# Patient Record
Sex: Female | Born: 1970 | Race: White | Hispanic: No | Marital: Married | Smoking: Former smoker
Health system: Southern US, Community
[De-identification: ages and names within clinical notes are randomized; demographics above are authoritative.]

## PROBLEM LIST (undated history)

## (undated) DIAGNOSIS — I639 Cerebral infarction, unspecified: Secondary | ICD-10-CM

## (undated) DIAGNOSIS — Q211 Atrial septal defect: Secondary | ICD-10-CM

## (undated) DIAGNOSIS — F419 Anxiety disorder, unspecified: Secondary | ICD-10-CM

## (undated) DIAGNOSIS — H539 Unspecified visual disturbance: Secondary | ICD-10-CM

## (undated) HISTORY — DX: Unspecified visual disturbance: H53.9

## (undated) HISTORY — PX: HYDRADENITIS EXCISION: SHX5243

## (undated) HISTORY — DX: Atrial septal defect: Q21.1

---

## 1998-01-02 ENCOUNTER — Other Ambulatory Visit: Admission: RE | Admit: 1998-01-02 | Discharge: 1998-01-02 | Payer: Self-pay | Admitting: Gynecology

## 1998-01-02 ENCOUNTER — Other Ambulatory Visit: Admission: RE | Admit: 1998-01-02 | Discharge: 1998-01-02 | Payer: Self-pay | Admitting: Obstetrics and Gynecology

## 1998-09-03 ENCOUNTER — Inpatient Hospital Stay (HOSPITAL_COMMUNITY): Admission: AD | Admit: 1998-09-03 | Discharge: 1998-09-05 | Payer: Self-pay | Admitting: Obstetrics & Gynecology

## 1999-01-25 ENCOUNTER — Other Ambulatory Visit: Admission: RE | Admit: 1999-01-25 | Discharge: 1999-01-25 | Payer: Self-pay | Admitting: Obstetrics & Gynecology

## 2018-07-04 ENCOUNTER — Emergency Department (HOSPITAL_COMMUNITY): Payer: BC Managed Care – PPO

## 2018-07-04 ENCOUNTER — Inpatient Hospital Stay (HOSPITAL_COMMUNITY)
Admission: EM | Admit: 2018-07-04 | Discharge: 2018-07-06 | DRG: 065 | Disposition: A | Discharge status: Home / Self Care | Payer: BC Managed Care – PPO | Attending: Internal Medicine | Admitting: Internal Medicine

## 2018-07-04 ENCOUNTER — Encounter: Payer: Self-pay | Admitting: Emergency Medicine

## 2018-07-04 DIAGNOSIS — Z880 Allergy status to penicillin: Secondary | ICD-10-CM | POA: Diagnosis not present

## 2018-07-04 DIAGNOSIS — E1165 Type 2 diabetes mellitus with hyperglycemia: Secondary | ICD-10-CM | POA: Diagnosis present

## 2018-07-04 DIAGNOSIS — E785 Hyperlipidemia, unspecified: Secondary | ICD-10-CM | POA: Diagnosis present

## 2018-07-04 DIAGNOSIS — R471 Dysarthria and anarthria: Secondary | ICD-10-CM | POA: Diagnosis present

## 2018-07-04 DIAGNOSIS — Z6826 Body mass index (BMI) 26.0-26.9, adult: Secondary | ICD-10-CM | POA: Diagnosis not present

## 2018-07-04 DIAGNOSIS — Q211 Atrial septal defect: Secondary | ICD-10-CM | POA: Diagnosis not present

## 2018-07-04 DIAGNOSIS — E663 Overweight: Secondary | ICD-10-CM | POA: Diagnosis present

## 2018-07-04 DIAGNOSIS — Z823 Family history of stroke: Secondary | ICD-10-CM | POA: Diagnosis not present

## 2018-07-04 DIAGNOSIS — E781 Pure hyperglyceridemia: Secondary | ICD-10-CM | POA: Diagnosis present

## 2018-07-04 DIAGNOSIS — I639 Cerebral infarction, unspecified: Secondary | ICD-10-CM | POA: Diagnosis present

## 2018-07-04 DIAGNOSIS — I63411 Cerebral infarction due to embolism of right middle cerebral artery: Secondary | ICD-10-CM | POA: Diagnosis present

## 2018-07-04 DIAGNOSIS — R4701 Aphasia: Secondary | ICD-10-CM | POA: Diagnosis not present

## 2018-07-04 DIAGNOSIS — E538 Deficiency of other specified B group vitamins: Secondary | ICD-10-CM | POA: Diagnosis not present

## 2018-07-04 DIAGNOSIS — I63311 Cerebral infarction due to thrombosis of right middle cerebral artery: Secondary | ICD-10-CM

## 2018-07-04 DIAGNOSIS — D519 Vitamin B12 deficiency anemia, unspecified: Secondary | ICD-10-CM | POA: Diagnosis not present

## 2018-07-04 DIAGNOSIS — D649 Anemia, unspecified: Secondary | ICD-10-CM | POA: Diagnosis not present

## 2018-07-04 DIAGNOSIS — I10 Essential (primary) hypertension: Secondary | ICD-10-CM | POA: Diagnosis present

## 2018-07-04 DIAGNOSIS — Z87891 Personal history of nicotine dependence: Secondary | ICD-10-CM

## 2018-07-04 DIAGNOSIS — I6389 Other cerebral infarction: Secondary | ICD-10-CM | POA: Diagnosis not present

## 2018-07-04 DIAGNOSIS — Z882 Allergy status to sulfonamides status: Secondary | ICD-10-CM

## 2018-07-04 DIAGNOSIS — Z881 Allergy status to other antibiotic agents status: Secondary | ICD-10-CM | POA: Diagnosis not present

## 2018-07-04 DIAGNOSIS — F419 Anxiety disorder, unspecified: Secondary | ICD-10-CM | POA: Diagnosis not present

## 2018-07-04 DIAGNOSIS — R29700 NIHSS score 0: Secondary | ICD-10-CM | POA: Diagnosis present

## 2018-07-04 DIAGNOSIS — Z8249 Family history of ischemic heart disease and other diseases of the circulatory system: Secondary | ICD-10-CM | POA: Diagnosis not present

## 2018-07-04 HISTORY — DX: Cerebral infarction, unspecified: I63.9

## 2018-07-04 HISTORY — DX: Anxiety disorder, unspecified: F41.9

## 2018-07-04 LAB — DIFFERENTIAL
Abs Immature Granulocytes: 0.02 10*3/uL (ref 0.00–0.07)
Basophils Absolute: 0.1 10*3/uL (ref 0.0–0.1)
Basophils Relative: 1 %
Eosinophils Absolute: 0.1 10*3/uL (ref 0.0–0.5)
Eosinophils Relative: 1 %
Immature Granulocytes: 0 %
LYMPHS ABS: 1.9 10*3/uL (ref 0.7–4.0)
Lymphocytes Relative: 24 %
Monocytes Absolute: 0.4 10*3/uL (ref 0.1–1.0)
Monocytes Relative: 6 %
Neutro Abs: 5.4 10*3/uL (ref 1.7–7.7)
Neutrophils Relative %: 68 %

## 2018-07-04 LAB — CBC
HCT: 42.1 % (ref 36.0–46.0)
Hemoglobin: 13.1 g/dL (ref 12.0–15.0)
MCH: 29.4 pg (ref 26.0–34.0)
MCHC: 31.1 g/dL (ref 30.0–36.0)
MCV: 94.4 fL (ref 80.0–100.0)
Platelets: 260 10*3/uL (ref 150–400)
RBC: 4.46 MIL/uL (ref 3.87–5.11)
RDW: 11.9 % (ref 11.5–15.5)
WBC: 7.8 10*3/uL (ref 4.0–10.5)
nRBC: 0 % (ref 0.0–0.2)

## 2018-07-04 LAB — COMPREHENSIVE METABOLIC PANEL
ALK PHOS: 66 U/L (ref 38–126)
ALT: 18 U/L (ref 0–44)
AST: 21 U/L (ref 15–41)
Albumin: 4 g/dL (ref 3.5–5.0)
Anion gap: 11 (ref 5–15)
BUN: 6 mg/dL (ref 6–20)
CALCIUM: 9.1 mg/dL (ref 8.9–10.3)
CO2: 21 mmol/L — ABNORMAL LOW (ref 22–32)
Chloride: 108 mmol/L (ref 98–111)
Creatinine, Ser: 0.63 mg/dL (ref 0.44–1.00)
GFR calc Af Amer: >60 mL/min (ref >60)
GFR calc non Af Amer: >60 mL/min (ref >60)
Glucose, Bld: 154 mg/dL — ABNORMAL HIGH (ref 70–99)
Potassium: 3.7 mmol/L (ref 3.5–5.1)
Sodium: 140 mmol/L (ref 135–145)
TOTAL PROTEIN: 6.6 g/dL (ref 6.5–8.1)
Total Bilirubin: 0.9 mg/dL (ref 0.3–1.2)

## 2018-07-04 LAB — I-STAT BETA HCG BLOOD, ED (MC, WL, AP ONLY): I-stat hCG, quantitative: <5 m[IU]/mL (ref <5)

## 2018-07-04 LAB — ETHANOL: Alcohol, Ethyl (B): <10 mg/dL (ref <10)

## 2018-07-04 LAB — APTT: aPTT: 28 seconds (ref 24–36)

## 2018-07-04 LAB — PROTIME-INR
INR: 0.99
Prothrombin Time: 13 seconds (ref 11.4–15.2)

## 2018-07-04 LAB — TSH: TSH: 1.284 u[IU]/mL (ref 0.350–4.500)

## 2018-07-04 LAB — I-STAT TROPONIN, ED: Troponin i, poc: 0.01 ng/mL (ref 0.00–0.08)

## 2018-07-04 MED ORDER — ASPIRIN 300 MG RE SUPP: 300.0000 mg | Freq: Every day | RECTAL | Status: DC

## 2018-07-04 MED ORDER — IOPAMIDOL (ISOVUE-370) INJECTION 76%
75.0000 mL | Freq: Once | INTRAVENOUS | Status: AC | PRN
  Administered 2018-07-04: 75 mL via INTRAVENOUS

## 2018-07-04 MED ORDER — ACETAMINOPHEN 160 MG/5ML PO SOLN: 650.0000 mg | ORAL | Status: DC | PRN

## 2018-07-04 MED ORDER — ASPIRIN 325 MG PO TABS
325.0000 mg | ORAL_TABLET | Freq: Every day | ORAL | Status: DC
  Administered 2018-07-04 – 2018-07-06 (×3): 325 mg via ORAL
  Filled 2018-07-04 (×3): qty 1

## 2018-07-04 MED ORDER — ACETAMINOPHEN 650 MG RE SUPP: 650.0000 mg | RECTAL | Status: DC | PRN

## 2018-07-04 MED ORDER — CLONAZEPAM 0.5 MG PO TABS
0.5000 mg | ORAL_TABLET | Freq: Every day | ORAL | Status: DC
  Administered 2018-07-05: 0.5 mg via ORAL
  Filled 2018-07-04: qty 1

## 2018-07-04 MED ORDER — ACETAMINOPHEN 325 MG PO TABS: 650.0000 mg | ORAL_TABLET | ORAL | Status: DC | PRN

## 2018-07-04 MED ORDER — SENNOSIDES-DOCUSATE SODIUM 8.6-50 MG PO TABS: 1.0000 | ORAL_TABLET | Freq: Every evening | ORAL | Status: DC | PRN

## 2018-07-04 MED ORDER — ENOXAPARIN SODIUM 40 MG/0.4ML ~~LOC~~ SOLN
40.0000 mg | SUBCUTANEOUS | Status: DC
  Administered 2018-07-04 – 2018-07-05 (×2): 40 mg via SUBCUTANEOUS
  Filled 2018-07-04 (×2): qty 0.4

## 2018-07-04 MED ORDER — SODIUM CHLORIDE 0.9 % IV SOLN
INTRAVENOUS | Status: DC
  Administered 2018-07-04 – 2018-07-06 (×3): via INTRAVENOUS

## 2018-07-04 MED ORDER — STROKE: EARLY STAGES OF RECOVERY BOOK
Freq: Once | Status: AC
  Administered 2018-07-04: 20:00:00
  Filled 2018-07-04: qty 1

## 2018-07-04 MED ORDER — SODIUM CHLORIDE 0.9 % IV BOLUS
1000.0000 mL | Freq: Once | INTRAVENOUS | Status: AC
  Administered 2018-07-05: 1000 mL via INTRAVENOUS

## 2018-07-04 MED ORDER — ATORVASTATIN CALCIUM 80 MG PO TABS
80.0000 mg | ORAL_TABLET | Freq: Every day | ORAL | Status: DC
  Administered 2018-07-04: 80 mg via ORAL
  Filled 2018-07-04: qty 1

## 2018-07-04 MED ORDER — LORATADINE 10 MG PO TABS
10.0000 mg | ORAL_TABLET | Freq: Every day | ORAL | Status: DC
  Administered 2018-07-05 – 2018-07-06 (×2): 10 mg via ORAL
  Filled 2018-07-04 (×2): qty 1

## 2018-07-04 NOTE — Progress Notes (Signed)
   07/04/18 1600  Clinical Encounter Type  Visited With Patient and family together;Patient;Family  Visit Type Initial;Follow-up;Social support;Psychological support;ED  Referral From Family;Other (Comment) (pt's relative is [employer] employee)  Spiritual Encounters  Spiritual Needs Emotional  Stress Factors  Patient Stress Factors Loss of control;Health changes;Exhausted  Family Stress Factors Loss of control;Lack of knowledge   Pt is SIL of [place] in [facility] ED, who told me pt was coming in to ED.  Met w/ pt's spouse at pt rm while pt out for scan.  Returned when came across family members who were in ED pod look for room; walked them to pt rm, introduced self to pt.  Myra Gianotti resident, [phone]

## 2018-07-04 NOTE — ED Notes (Signed)
Pt had no difficulties with swallowing.

## 2018-07-04 NOTE — H&P (Signed)
History and Physical    Krystal Oneal NWG:[id number] DOB: 1971/06/05 DOA: 07/04/2018  PCP: Everlean CherryWhyte, Thomas M, MD Consultants:  None Patient coming from:  Home - lives with husband and son; Jackey LogeOK: Husband, [phone]  Chief Complaint: neurologic symptoms  HPI: Krystal Heysabitha Bushart is a 48 y.o. female with medical history significant of anxiety presenting with dizziness and near syncope.  She got dizzy and nauseated with slurred speech and dyscoordination.  She was fine this AM until about 10AM.  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  She was dizzy and unable to talk.  She finally told him to go back to his desk.  She was dropping things on the floor and the teacher came to check on her.  She was talking nonsense.  She did not have difficulty walking.  Her symptoms lasted through arrival in the ER, still having dysarthria.  Symptoms finally improved about 2-3pm.  Now she feels very tired and sleepy.     ED Course:  No risk factors, had a stroke today.  Last known normal was 10AM.  Neurology is following - back to baseline now but she is an IR candidate until 10AM tomorrow so if symptoms recur please reconsult neuro.  Neuro will order an array of labs given lack of RF and young age.  Review of Systems: As per HPI; otherwise review of systems reviewed and negative.   Ambulatory Status:  Ambulates without assistance  Past Medical History:  Diagnosis Date  . Anxiety   . CVA (cerebral vascular accident) (HCC) 07/04/2018    Past Surgical History:  Procedure Laterality Date  . HYDRADENITIS EXCISION      Social History   Socioeconomic History  . Marital status: Married    Spouse name: Not on file  . Number of children: Not on file  . Years of education: Not on file  . Highest education level: Not on file  Occupational History  . Occupation: Geologist, engineeringteacher assistant  Social Needs  . Financial resource strain: Not on file  . Food insecurity:    Worry: Not on  file    Inability: Not on file  . Transportation needs:    Medical: Not on file    Non-medical: Not on file  Tobacco Use  . Smoking status: Former Smoker    Packs/day: 1.00    Years: 30.00    Pack years: 30.00    Types: Cigarettes    Last attempt to quit: 2017    Years since quitting: 3.0  . Smokeless tobacco: Never Used  Substance and Sexual Activity  . Alcohol use: Never    Frequency: Never  . Drug use: Never  . Sexual activity: Not on file  Lifestyle  . Physical activity:    Days per week: Not on file    Minutes per session: Not on file  . Stress: Not on file  Relationships  . Social connections:    Talks on phone: Not on file    Gets together: Not on file    Attends religious service: Not on file    Active member of club or organization: Not on file    Attends meetings of clubs or organizations: Not on file    Relationship status: Not on file  . Intimate partner violence:    Fear of current or ex partner: Not on file    Emotionally abused: Not on file    Physically abused: Not on file    Forced sexual  activity: Not on file  Other Topics Concern  . Not on file  Social History Narrative  . Not on file    Allergies  Allergen Reactions  . Sulfa Antibiotics Itching  . Amoxicillin Hives and Rash  . Penicillins Hives and Rash    Did it involve swelling of the face/tongue/throat, SOB, or low BP? Yes Did it involve sudden or severe rash/hives, skin peeling, or any reaction on the inside of your mouth or nose? Yes Did you need to seek medical attention at a hospital or doctor's office? No When did it last happen?2 years ago If all above answers are "NO", may proceed with cephalosporin use.     Family History  Problem Relation Age of Onset  . Hypertension Mother   . CVA Mother 74  . Hypertension Father     Prior to Admission medications   Not on File    Physical Exam: Vitals:   07/04/18 1147 07/04/18 1148 07/04/18 1200 07/04/18 1330  BP:  117/85  113/62   Pulse:  90 95 76  Resp:  (!) 23 17 18   Temp:  98.5 F (36.9 C)    TempSrc:  Oral    SpO2:  97% 96% 96%  Weight: 65.8 kg     Height: 5\' 2"  (1.575 m)        . General:  Appears calm and comfortable and is NAD . Eyes:  PERRL, EOMI, normal lids, iris . ENT:  grossly normal hearing, lips & tongue, mmm; appropriate dentition . Neck:  no LAD, masses or thyromegaly; no carotid bruits . Cardiovascular:  RRR, no m/r/g. No LE edema.  Marland Kitchen Respiratory:   CTA bilaterally with no wheezes/rales/rhonchi.  Normal respiratory effort. . Abdomen:  soft, NT, ND, NABS . Back:   normal alignment, no CVAT . Skin:  no rash or induration seen on limited exam . Musculoskeletal:  grossly normal tone BUE/BLE, good ROM, no bony abnormality . Psychiatric: grossly normal mood and affect, speech fluent and appropriate, AOx3 . Neurologic:  CN 2-12 grossly intact, moves all extremities in coordinated fashion, sensation intact    Radiological Exams on Admission: Ct Angio Head W Or Wo Contrast  Result Date: 07/04/2018 CLINICAL DATA:  Code stroke.  Aphasia.  Left-sided weakness. EXAM: CT ANGIOGRAPHY HEAD AND NECK TECHNIQUE: Multidetector CT imaging of the head and neck was performed using the standard protocol during bolus administration of intravenous contrast. Multiplanar CT image reconstructions and MIPs were obtained to evaluate the vascular anatomy. Carotid stenosis measurements (when applicable) are obtained utilizing NASCET criteria, using the distal internal carotid diameter as the denominator. CONTRAST:  18mL ISOVUE-370 IOPAMIDOL (ISOVUE-370) INJECTION 76% COMPARISON:  MRI 07/04/2018 FINDINGS: CT HEAD FINDINGS Brain: No evidence of acute infarction, hemorrhage, hydrocephalus, extra-axial collection or mass lesion/mass effect. Vascular: Negative for hyperdense vessel Skull: Negative Sinuses: Negative Orbits: Negative Review of the MIP images confirms the above findings CTA NECK FINDINGS Aortic arch: Normal  aortic arch. Negative for dissection or atherosclerotic disease. Left vertebral artery origin from the arch Right carotid system: Normal right carotid without stenosis or irregularity. Left carotid system: Normal left carotid without stenosis or irregularity Vertebral arteries: Both vertebral arteries widely patent without stenosis or irregularity. Left vertebral artery origin from the aortic arch. Skeleton: Negative Other neck: Negative Upper chest: Negative Review of the MIP images confirms the above findings CTA HEAD FINDINGS Anterior circulation: Cavernous carotid normal bilaterally. Proximal anterior and middle cerebral arteries widely patent bilaterally without stenosis. Occluded branch of distal right MCA  in the right parietal region. This corresponds to right M3/M4 occluded segment and corresponds to the area of restricted diffusion on MRI. This is likely a small embolus. No other branch occlusions. Posterior circulation: Both vertebral arteries widely patent to the basilar. PICA patent bilaterally. Basilar widely patent. AICA, superior cerebellar, and posterior cerebral arteries widely patent Venous sinuses: Patent Anatomic variants: None Delayed phase: Not performed Review of the MIP images confirms the above findings IMPRESSION: 1. Right M3/M4 branch occlusion in the right parietal lobe corresponding to acute infarct on MRI. This is presumably an embolus from the heart. No other significant intracranial or extracranial disease. 2. These results were called by telephone at the time of interpretation on 07/04/2018 at 4:09 pm to Dr. Otelia Limes , who verbally acknowledged these results. Electronically Signed   By: Marlan Palau M.D.   On: 07/04/2018 16:10   Ct Angio Neck W And/or Wo Contrast  Result Date: 07/04/2018 CLINICAL DATA:  Code stroke.  Aphasia.  Left-sided weakness. EXAM: CT ANGIOGRAPHY HEAD AND NECK TECHNIQUE: Multidetector CT imaging of the head and neck was performed using the standard protocol  during bolus administration of intravenous contrast. Multiplanar CT image reconstructions and MIPs were obtained to evaluate the vascular anatomy. Carotid stenosis measurements (when applicable) are obtained utilizing NASCET criteria, using the distal internal carotid diameter as the denominator. CONTRAST:  75mL ISOVUE-370 IOPAMIDOL (ISOVUE-370) INJECTION 76% COMPARISON:  MRI 07/04/2018 FINDINGS: CT HEAD FINDINGS Brain: No evidence of acute infarction, hemorrhage, hydrocephalus, extra-axial collection or mass lesion/mass effect. Vascular: Negative for hyperdense vessel Skull: Negative Sinuses: Negative Orbits: Negative Review of the MIP images confirms the above findings CTA NECK FINDINGS Aortic arch: Normal aortic arch. Negative for dissection or atherosclerotic disease. Left vertebral artery origin from the arch Right carotid system: Normal right carotid without stenosis or irregularity. Left carotid system: Normal left carotid without stenosis or irregularity Vertebral arteries: Both vertebral arteries widely patent without stenosis or irregularity. Left vertebral artery origin from the aortic arch. Skeleton: Negative Other neck: Negative Upper chest: Negative Review of the MIP images confirms the above findings CTA HEAD FINDINGS Anterior circulation: Cavernous carotid normal bilaterally. Proximal anterior and middle cerebral arteries widely patent bilaterally without stenosis. Occluded branch of distal right MCA in the right parietal region. This corresponds to right M3/M4 occluded segment and corresponds to the area of restricted diffusion on MRI. This is likely a small embolus. No other branch occlusions. Posterior circulation: Both vertebral arteries widely patent to the basilar. PICA patent bilaterally. Basilar widely patent. AICA, superior cerebellar, and posterior cerebral arteries widely patent Venous sinuses: Patent Anatomic variants: None Delayed phase: Not performed Review of the MIP images confirms  the above findings IMPRESSION: 1. Right M3/M4 branch occlusion in the right parietal lobe corresponding to acute infarct on MRI. This is presumably an embolus from the heart. No other significant intracranial or extracranial disease. 2. These results were called by telephone at the time of interpretation on 07/04/2018 at 4:09 pm to Dr. Otelia Limes , who verbally acknowledged these results. Electronically Signed   By: Marlan Palau M.D.   On: 07/04/2018 16:10   Mr Brain Wo Contrast  Result Date: 07/04/2018 CLINICAL DATA:  Left arm weakness, aphasia. EXAM: MRI HEAD WITHOUT CONTRAST TECHNIQUE: Multiplanar, multiecho pulse sequences of the brain and surrounding structures were obtained without intravenous contrast. COMPARISON:  MRI head 06/26/2003 FINDINGS: Brain: Restricted diffusion in the right parietal lobe compatible with acute infarct. This involves region of the motor strep as well as the  high medial parietal lobe. Small area of acute infarct extends into the posterior insula on the right. FLAIR imaging negative for edema. Ventricle size normal. No significant chronic ischemia. Negative for mass lesion. Vascular: Small area of susceptibility in the right parietal lobe probable area of right MCA clot. There appears to be increased signal in the right parietal branch in this area suggesting slow flow. Skull and upper cervical spine: Negative Sinuses/Orbits: Negative Other: Mild IMPRESSION: Acute infarct posterior right MCA territory involving the posterior insula and right parietal lobe in the region of the motor cortex. Negative FLAIR imaging. Probable clot in distal right MCA branch versus focal microhemorrhage. Increased flow in this vessel on FLAIR suggests slow flow. These results were called by telephone at the time of interpretation on 07/04/2018 at 3:00 pm to Dr. Particia Nearing, who verbally acknowledged these results. Electronically Signed   By: Marlan Palau M.D.   On: 07/04/2018 15:01    EKG: Independently  reviewed.  NSR with rate 87; low voltage with no evidence of acute ischemia   Labs on Admission: I have personally reviewed the available labs and imaging studies at the time of the admission.  Pertinent labs:   CO2 21 Glucose 154 Otherwise normal CMP Normal CBC INR 0.99 ETOH <10  Assessment/Plan Principal Problem:   CVA (cerebral vascular accident) (HCC) Active Problems:   Anxiety    CVA -Patient without known CVD RF presenting with acute onset of neurologic symptoms this AM concerning for CVA -MRI confirms posterior right MCA CVA -Symptoms have since resolved -Will admit for further CVA evaluation -Telemetry monitoring -Echo with bubble study -Risk stratification with FLP, A1c; will also check TSH and UDS -ASA daily -Neurology consult -PT/OT/ST/Nutrition Consults -No known h/o HTN but will monitor and allow permissive HTN for now; current BPs are low normal -Check FLP; start Lipitor 80 mg daily -No known h/o DM - check A1c and monitor -She will likely need a hypercoagulable work-up, but will defer to neuro at this time -If her symptoms recur, she remains an IR candidate through 10AM tomorrow.  The patient and her family have been informed to monitor carefully for development of symptoms and notify nursing staff immediately if symptoms recur.   Anxiety -This is her only know medical problem and she takes prn Klonopin qhs for this issue; will continue     DVT prophylaxis:  Lovenox  Code Status: Full - confirmed with patient/family Family Communication: Multiple family members present throughout evaluation Disposition Plan:  Home once clinically improved Consults called: Neurology; PT/OT/ST/Nutrition  Admission status: Admit - It is my clinical opinion that admission to INPATIENT is reasonable and necessary because of the expectation that this patient will require hospital care that crosses at least 2 midnights to treat this condition based on the medical complexity of  the problems presented.  Given the aforementioned information, the predictability of an adverse outcome is felt to be significant.   Jonah Blue MD [facility]  If note is complete, please contact covering daytime or nighttime physician. [email or web address]   07/04/2018, 5:21 PM

## 2018-07-04 NOTE — Code Documentation (Signed)
48 yo female coming from work with complaints of sudden onset of trouble speaking and weakness of her left hand. Pt has hx of smoking and menopause placed on estradiol in October. No other medical hx noted by patient. Pt called EMS and when EMS arrived symptoms were resolved. Upon arrival to ED, pt had an NIHSS 0 per medical staff. EDP ordered MRI and MRI showed acute infarct of the right MCA territory. Code Stroke activated and Stroke Team met patient in E33. Initial NIHSS 0. CTA ordered due to presenting symptoms. CTA showed M3/M4 occlusion with corresponding MRI. Patient to be admitted. Handoff given to Ronalee Belts, Therapist, sports.

## 2018-07-04 NOTE — ED Notes (Signed)
Activated code stroke with [facility] @ 15:00 per Dr. Particia Nearing

## 2018-07-04 NOTE — ED Notes (Signed)
Pt transported to MRI 

## 2018-07-04 NOTE — Consult Note (Signed)
Neurology Consultation  Reason for Consult: Code stroke  Referring Physician: Dr. Gilford Raid  CC: Code stroke  History is obtained from: patient  HPI: Krystal Oneal is a left handed 48 y.o. female with a past medical history of anxiety.  She works as a Pharmacist, hospital.  She states that during work today she was talking to 1 of her students when suddenly she could not get her words out.  She also noted persistent weakness in her left arm in which she was dropping things and could not pick them up.  This lasted for about 45 minutes. By the time she got to the hospital all her symptoms had resolved.  MRI did show an acute infarct in the posterior right MCA territory involving the posterior insula and right parietal lobe in the region of the motor cortex.  Code stroke was called.  At time of arrival all symptoms had resolved.  LKW: 10 AM on 07/04/2018 tpa given?: no, symptoms resolved Premorbid modified Rankin scale (mRS): 0 Stroke scale 0  ROS: A 14 point ROS was performed and is negative except as noted in the HPI.  Past Medical History:  Diagnosis Date  . Anxiety      Family History  Problem Relation Age of Onset  . Hypertension Mother   . Hypertension Father    Social History:   reports that she has quit smoking. She has never used smokeless tobacco. She reports previous alcohol use. She reports previous drug use.  Medications No current facility-administered medications for this encounter.  No current outpatient medications on file.  Exam: Current vital signs: BP 113/62   Pulse 76   Temp 98.5 F (36.9 C) (Oral)   Resp 18   Ht _0  (1.575 m)   Wt 65.8 kg   LMP 06/03/2018   SpO2 96%   BMI 26.52 kg/m  Vital signs in last 24 hours: Temp:  [98.5 F (36.9 C)] 98.5 F (36.9 C) (01/15 1148) Pulse Rate:  [76-95] 76 (01/15 1330) Resp:  [17-23] 18 (01/15 1330) BP: (113-117)/(62-85) 113/62 (01/15 1200) SpO2:  [96 %-97 %] 96 % (01/15 1330) Weight:  [65.8 kg] 65.8 kg (01/15  1147)  Physical Exam  Constitutional: Appears well-developed and well-nourished.  Psych: Affect appropriate to situation Eyes: No scleral injection HENT: No OP obstrucion Head: Normocephalic.  Cardiovascular: Normal rate and regular rhythm.  Respiratory: Effort normal, non-labored breathing GI: Soft.  No distension. There is no tenderness.  Skin: WDI  Neurological Evaluation: Mental Status: Patient is awake, alert, oriented to person, place, month, year, and situation. Patient is able to give a clear and coherent history. No signs of aphasia or neglect Cranial Nerves: II: Visual Fields are full.   III,IV, VI: EOMI without ptosis or diplopia.  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  Pupils are equal, round, and reactive to light. V: Facial sensation is symmetric to temperature VII: Facial movement is symmetric.  VIII: hearing is intact to voice X: No hypophonia XI: Shoulder shrug is symmetric. XII: tongue is midline without atrophy or fasciculations.  Motor: Tone is normal. Bulk is normal. 5/5 strength was present in all four extremities.  Sensory: Sensation is symmetric to light touch and temperature in the arms and legs. Deep Tendon Reflexes: 2+ and symmetric in the biceps and patellae.  Plantars: Toes are downgoing bilaterally.  Cerebellar: FNF and HKS are intact bilaterally  NIHSS: 0   Labs I have reviewed labs in epic and the results pertinent to  this consultation are:  CBC    Component Value Date/Time   WBC 7.8 07/04/2018 1216   RBC 4.46 07/04/2018 1216   HGB 13.1 07/04/2018 1216   HCT 42.1 07/04/2018 1216   PLT 260 07/04/2018 1216   MCV 94.4 07/04/2018 1216   MCH 29.4 07/04/2018 1216   MCHC 31.1 07/04/2018 1216   RDW 11.9 07/04/2018 1216   LYMPHSABS 1.9 07/04/2018 1216   MONOABS 0.4 07/04/2018 1216   EOSABS 0.1 07/04/2018 1216   BASOSABS 0.1 07/04/2018 1216    CMP     Component Value Date/Time   NA 140 07/04/2018 1216   K  3.7 07/04/2018 1216   CL 108 07/04/2018 1216   CO2 21 (L) 07/04/2018 1216   GLUCOSE 154 (H) 07/04/2018 1216   BUN 6 07/04/2018 1216   CREATININE 0.63 07/04/2018 1216   CALCIUM 9.1 07/04/2018 1216   PROT 6.6 07/04/2018 1216   ALBUMIN 4.0 07/04/2018 1216   AST 21 07/04/2018 1216   ALT 18 07/04/2018 1216   ALKPHOS 66 07/04/2018 1216   BILITOT 0.9 07/04/2018 1216   GFRNONAA >60 07/04/2018 1216   GFRAA >60 07/04/2018 1216    Lipid Panel  No results found for: CHOL, TRIG, HDL, CHOLHDL, VLDL, LDLCALC, LDLDIRECT   Imaging I have reviewed the images obtained:  MRI examination of the brain:  Acute infarct posterior right MCA territory involving the posterior insula and right parietal and frontal lobe in the region of the motor cortex as seen on DWI. Negative FLAIR imaging correlate to the DWI findings. Probable clot in distal right MCA branch versus focal microhemorrhage. Increased signal in this vessel on FLAIR suggests slow flow.  CTA of head and neck: Right M3/M4 branch occlusion in the right parietal lobe corresponding to acute infarct on MRI. This is presumably an embolus from the heart. No other significant intracranial or extracranial disease.   Etta Quill PA-C [facility] [phone] 07/04/2018, 3:57 PM     Assessment:  48 year old female presenting to the hospital with transient expressive aphasia and left arm weakness.  Symptoms resolved within 45 minutes.   1. MRI reveals acute infarctions in the right MCA territory.  2. Also noted on MRI is a probable clot in the distal right MCA branch versus focal microhemorrhage per Radiology - Neurologist has reviewed the images and focal microhemorrhage is felt to be unlikely. Increased signal in this vessel on FLAIR suggests slow flow. 3. CTA head and neck: Right M3/M4 branch occlusion in the right parietal lobe corresponding to acute infarct on MRI. This is presumably an embolus from the heart. No other significant  intracranial or extracranial disease. 4. The patient is not a tPA candidate due to NIHSS of 0 and being out of the time window 5. The patient is not an endovascular candidate due to distal location of clot in right M3/M4 branch, limiting accessibility. Also not a candidate due to NIHSS of 0  Recommendations: # Transthoracic Echo. If negative, she will most likely need TEE # Start patient on ASA 325 mg daily,   # Start or continue Atorvastatin 80 mg/other high intensity statin # BP goal: permissive HTN upto 220/120 mmHg for 24 hours, then decrease BP by 15% per day to a SBP goal of 120-130 # HBAIC and Lipid profile # Telemetry monitoring # Frequent neuro checks # NPO until passes stroke swallow screen # Hypercoagulable panel # ESR, C-reactive protein and lupus panel # please page stroke NP  Or  PA  Or MD  from 8am -4 pm  as this patient from this time will be  followed by the stroke.   You can look them up on [email or web address]  Password [email or web address]   I have examined the patient. Exam findings documented by Etta Quill, PA. I have formulated the assessment and plan. Electronically signed: Dr. Kerney Elbe

## 2018-07-04 NOTE — ED Provider Notes (Signed)
[facility] EMERGENCY DEPARTMENT Provider Note   CSN: [id number] Arrival date & time: 07/04/18  1138     History   Chief Complaint Chief Complaint  Patient presents with  . Dizziness  . Near Syncope    HPI Ahnya Saturday is a 48 y.o. female.  The history is provided by the patient and medical records. No language interpreter was used.   Anora Castrogiovanni is a 48 y.o. female  who presents to the Emergency Department complaining of left-sided numbness and weakness.  Patient states that she was working at school as a Runner, broadcasting/film/video when she acutely developed numbness to her left side of the face and left arm 10 AM.  She felt as if she knew what to say, but could not get the words out.  She is having a hard time communicating to the other adult in the room who called 911.  She also felt weak in her left arm, persistently dropping things when she tried to pick them up.  She states the symptoms were present for about 45 minutes, but have since resolved.  She has never experienced this in the past.  He reports no past medical history other than anxiety.  Per chart review, it also appears that she has hypertriglyceridemia which has been trying to be treated with diet.  Last appointment with her PCP was in August 2019.  She denies any headache.  No lower extremity weakness or difficulty with ambulation.  No vision changes.  No medications taken prior to arrival for her symptoms.   Past Medical History:  Diagnosis Date  . Anxiety     There are no active problems to display for this patient.   History reviewed. No pertinent surgical history.   OB History   No obstetric history on file.     Home Medications    Prior to Admission medications   Not on File    Family History Family History  Problem Relation Age of Onset  . Hypertension Mother   . Hypertension Father     Social History Social History   Tobacco Use  . Smoking status: Former Games developer  . Smokeless tobacco:  Never Used  Substance Use Topics  . Alcohol use: Not Currently  . Drug use: Not Currently     Allergies   Amoxicillin; Penicillins; and Sulfa antibiotics   Review of Systems Review of Systems  Neurological: Positive for speech difficulty, weakness and numbness. Negative for syncope, light-headedness and headaches.  All other systems reviewed and are negative.    Physical Exam Updated Vital Signs BP 113/62   Pulse 76   Temp 98.5 F (36.9 C) (Oral)   Resp 18   Ht 5\' 2"  (1.575 m)   Wt 65.8 kg   LMP 06/03/2018   SpO2 96%   BMI 26.52 kg/m   Physical Exam Vitals signs and nursing note reviewed.  Constitutional:      General: She is not in acute distress.    Appearance: She is well-developed.  HENT:     Head: Normocephalic and atraumatic.  Cardiovascular:     Rate and Rhythm: Normal rate and regular rhythm.     Heart sounds: Normal heart sounds. No murmur.  Pulmonary:     Effort: Pulmonary effort is normal. No respiratory distress.     Breath sounds: Normal breath sounds.  Abdominal:     General: There is no distension.     Palpations: Abdomen is soft.     Tenderness: There is no  abdominal tenderness.  Skin:    General: Skin is warm and dry.     Comments: Alert, oriented, thought content appropriate, able to give a coherent history. Speech is clear and goal oriented, able to follow commands.  Cranial Nerves:  II:  Peripheral visual fields grossly normal, pupils equal, round, reactive to light III, IV, VI: EOM intact bilaterally, ptosis not present V,VII: smile symmetric, eyes kept closed tightly against resistance, facial light touch sensation equal VIII: hearing grossly normal IX, X: symmetric soft palate movement, uvula elevates symmetrically  XI: bilateral shoulder shrug symmetric and strong XII: midline tongue extension 5/5 muscle strength in upper and lower extremities bilaterally including strong and equal grip strength and dorsiflexion/plantar  flexion Sensory to light touch normal in all four extremities.  Normal finger-to-nose and rapid alternating movements; normal gait and balance. No pronator drift.  Neurological:     Mental Status: She is alert and oriented to person, place, and time.      ED Treatments / Results  Labs (all labs ordered are listed, but only abnormal results are displayed) Labs Reviewed  COMPREHENSIVE METABOLIC PANEL - Abnormal; Notable for the following components:      Result Value   CO2 21 (*)    Glucose, Bld 154 (*)    All other components within normal limits  ETHANOL  PROTIME-INR  APTT  CBC  DIFFERENTIAL  RAPID URINE DRUG SCREEN, HOSP PERFORMED  URINALYSIS, ROUTINE W REFLEX MICROSCOPIC  I-STAT TROPONIN, ED  I-STAT BETA HCG BLOOD, ED ([facility], [facility], [facility] ONLY)    EKG EKG Interpretation  Date/Time:  Wednesday July 04 2018 11:46:38 EST Ventricular Rate:  87 PR Interval:    QRS Duration: 94 QT Interval:  354 QTC Calculation: 426 R Axis:   44 Text Interpretation:  Sinus rhythm Low voltage, precordial leads No old tracing to compare Confirmed by Jacalyn LefevreHaviland, Julie [phone]([id number]) on 07/04/2018 11:56:57 AM Also confirmed by Jacalyn LefevreHaviland, Julie [phone]), editor Barbette Hairassel, Kerry [phone])  on 07/04/2018 3:10:13 PM   Radiology Ct Angio Head W Or Wo Contrast  Result Date: 07/04/2018 CLINICAL DATA:  Code stroke.  Aphasia.  Left-sided weakness. EXAM: CT ANGIOGRAPHY HEAD AND NECK TECHNIQUE: Multidetector CT imaging of the head and neck was performed using the standard protocol during bolus administration of intravenous contrast. Multiplanar CT image reconstructions and MIPs were obtained to evaluate the vascular anatomy. Carotid stenosis measurements (when applicable) are obtained utilizing NASCET criteria, using the distal internal carotid diameter as the denominator. CONTRAST:  75mL ISOVUE-370 IOPAMIDOL (ISOVUE-370) INJECTION 76% COMPARISON:  MRI 07/04/2018 FINDINGS: CT HEAD FINDINGS Brain: No evidence of acute infarction,  hemorrhage, hydrocephalus, extra-axial collection or mass lesion/mass effect. Vascular: Negative for hyperdense vessel Skull: Negative Sinuses: Negative Orbits: Negative Review of the MIP images confirms the above findings CTA NECK FINDINGS Aortic arch: Normal aortic arch. Negative for dissection or atherosclerotic disease. Left vertebral artery origin from the arch Right carotid system: Normal right carotid without stenosis or irregularity. Left carotid system: Normal left carotid without stenosis or irregularity Vertebral arteries: Both vertebral arteries widely patent without stenosis or irregularity. Left vertebral artery origin from the aortic arch. Skeleton: Negative Other neck: Negative Upper chest: Negative Review of the MIP images confirms the above findings CTA HEAD FINDINGS Anterior circulation: Cavernous carotid normal bilaterally. Proximal anterior and middle cerebral arteries widely patent bilaterally without stenosis. Occluded branch of distal right MCA in the right parietal region. This corresponds to right M3/M4 occluded segment and corresponds to the area of restricted diffusion on MRI.  This is likely a small embolus. No other branch occlusions. Posterior circulation: Both vertebral arteries widely patent to the basilar. PICA patent bilaterally. Basilar widely patent. AICA, superior cerebellar, and posterior cerebral arteries widely patent Venous sinuses: Patent Anatomic variants: None Delayed phase: Not performed Review of the MIP images confirms the above findings IMPRESSION: 1. Right M3/M4 branch occlusion in the right parietal lobe corresponding to acute infarct on MRI. This is presumably an embolus from the heart. No other significant intracranial or extracranial disease. 2. These results were called by telephone at the time of interpretation on 07/04/2018 at 4:09 pm to Dr. Otelia Limes , who verbally acknowledged these results. Electronically Signed   By: Marlan Palau M.D.   On: 07/04/2018 16:10     Ct Angio Neck W And/or Wo Contrast  Result Date: 07/04/2018 CLINICAL DATA:  Code stroke.  Aphasia.  Left-sided weakness. EXAM: CT ANGIOGRAPHY HEAD AND NECK TECHNIQUE: Multidetector CT imaging of the head and neck was performed using the standard protocol during bolus administration of intravenous contrast. Multiplanar CT image reconstructions and MIPs were obtained to evaluate the vascular anatomy. Carotid stenosis measurements (when applicable) are obtained utilizing NASCET criteria, using the distal internal carotid diameter as the denominator. CONTRAST:  75mL ISOVUE-370 IOPAMIDOL (ISOVUE-370) INJECTION 76% COMPARISON:  MRI 07/04/2018 FINDINGS: CT HEAD FINDINGS Brain: No evidence of acute infarction, hemorrhage, hydrocephalus, extra-axial collection or mass lesion/mass effect. Vascular: Negative for hyperdense vessel Skull: Negative Sinuses: Negative Orbits: Negative Review of the MIP images confirms the above findings CTA NECK FINDINGS Aortic arch: Normal aortic arch. Negative for dissection or atherosclerotic disease. Left vertebral artery origin from the arch Right carotid system: Normal right carotid without stenosis or irregularity. Left carotid system: Normal left carotid without stenosis or irregularity Vertebral arteries: Both vertebral arteries widely patent without stenosis or irregularity. Left vertebral artery origin from the aortic arch. Skeleton: Negative Other neck: Negative Upper chest: Negative Review of the MIP images confirms the above findings CTA HEAD FINDINGS Anterior circulation: Cavernous carotid normal bilaterally. Proximal anterior and middle cerebral arteries widely patent bilaterally without stenosis. Occluded branch of distal right MCA in the right parietal region. This corresponds to right M3/M4 occluded segment and corresponds to the area of restricted diffusion on MRI. This is likely a small embolus. No other branch occlusions. Posterior circulation: Both vertebral arteries  widely patent to the basilar. PICA patent bilaterally. Basilar widely patent. AICA, superior cerebellar, and posterior cerebral arteries widely patent Venous sinuses: Patent Anatomic variants: None Delayed phase: Not performed Review of the MIP images confirms the above findings IMPRESSION: 1. Right M3/M4 branch occlusion in the right parietal lobe corresponding to acute infarct on MRI. This is presumably an embolus from the heart. No other significant intracranial or extracranial disease. 2. These results were called by telephone at the time of interpretation on 07/04/2018 at 4:09 pm to Dr. Otelia Limes , who verbally acknowledged these results. Electronically Signed   By: Marlan Palau M.D.   On: 07/04/2018 16:10   Mr Brain Wo Contrast  Result Date: 07/04/2018 CLINICAL DATA:  Left arm weakness, aphasia. EXAM: MRI HEAD WITHOUT CONTRAST TECHNIQUE: Multiplanar, multiecho pulse sequences of the brain and surrounding structures were obtained without intravenous contrast. COMPARISON:  MRI head 06/26/2003 FINDINGS: Brain: Restricted diffusion in the right parietal lobe compatible with acute infarct. This involves region of the motor strep as well as the high medial parietal lobe. Small area of acute infarct extends into the posterior insula on the right. FLAIR imaging negative for  edema. Ventricle size normal. No significant chronic ischemia. Negative for mass lesion. Vascular: Small area of susceptibility in the right parietal lobe probable area of right MCA clot. There appears to be increased signal in the right parietal branch in this area suggesting slow flow. Skull and upper cervical spine: Negative Sinuses/Orbits: Negative Other: Mild IMPRESSION: Acute infarct posterior right MCA territory involving the posterior insula and right parietal lobe in the region of the motor cortex. Negative FLAIR imaging. Probable clot in distal right MCA branch versus focal microhemorrhage. Increased flow in this vessel on FLAIR  suggests slow flow. These results were called by telephone at the time of interpretation on 07/04/2018 at 3:00 pm to Dr. Particia NearingHaviland, who verbally acknowledged these results. Electronically Signed   By: Marlan Palauharles  Clark M.D.   On: 07/04/2018 15:01    Procedures Procedures (including critical care time)  Medications Ordered in ED Medications  iopamidol (ISOVUE-370) 76 % injection 75 mL (75 mLs Intravenous Contrast Given 07/04/18 1543)     Initial Impression / Assessment and Plan / ED Course  I have reviewed the triage vital signs and the nursing notes.  Pertinent labs & imaging results that were available during my care of the patient were reviewed by me and considered in my medical decision making (see chart for details).    Blair Heysabitha Cancio is a 48 y.o. female who presents to ED for acute onset of aphasia and left upper extremity/facial numbness.  She also had left upper extremity weakness as she kept dropping things.  This began at 10 AM this morning.  No neuro deficits currently on exam and she feels back to her baseline.  Labs unremarkable.  MRI does show infarct to posterior right MCA.  Code stroke initiated.  Sent for angio films.  Neurology following - see notes for full recommendations. Recommend hospitalist admission.    Final Clinical Impressions(s) / ED Diagnoses   Final diagnoses:  Cerebrovascular accident (CVA), unspecified mechanism [facility](HCC)    ED Discharge Orders    None       Joyous Gleghorn, Chase PicketJaime Pilcher, PA-C 07/04/18 1646    Jacalyn LefevreHaviland, Julie, MD 07/05/18 [phone]

## 2018-07-04 NOTE — ED Triage Notes (Signed)
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 States that her l hand was numb. Denies numbness at this time.

## 2018-07-05 ENCOUNTER — Inpatient Hospital Stay (HOSPITAL_COMMUNITY): Payer: BC Managed Care – PPO

## 2018-07-05 ENCOUNTER — Encounter (HOSPITAL_COMMUNITY): Payer: Self-pay | Admitting: Primary Care

## 2018-07-05 DIAGNOSIS — I6389 Other cerebral infarction: Secondary | ICD-10-CM

## 2018-07-05 DIAGNOSIS — I639 Cerebral infarction, unspecified: Secondary | ICD-10-CM

## 2018-07-05 DIAGNOSIS — I63411 Cerebral infarction due to embolism of right middle cerebral artery: Principal | ICD-10-CM

## 2018-07-05 DIAGNOSIS — E785 Hyperlipidemia, unspecified: Secondary | ICD-10-CM

## 2018-07-05 DIAGNOSIS — D519 Vitamin B12 deficiency anemia, unspecified: Secondary | ICD-10-CM

## 2018-07-05 LAB — ECHOCARDIOGRAM COMPLETE
Height: 62 in
Weight: 2320 oz

## 2018-07-05 LAB — COMPREHENSIVE METABOLIC PANEL
ALT: 13 U/L (ref 0–44)
AST: 18 U/L (ref 15–41)
Albumin: 3.3 g/dL — ABNORMAL LOW (ref 3.5–5.0)
Alkaline Phosphatase: 56 U/L (ref 38–126)
Anion gap: 7 (ref 5–15)
BUN: <5 mg/dL — ABNORMAL LOW (ref 6–20)
CO2: 23 mmol/L (ref 22–32)
Calcium: 8.3 mg/dL — ABNORMAL LOW (ref 8.9–10.3)
Chloride: 112 mmol/L — ABNORMAL HIGH (ref 98–111)
Creatinine, Ser: 0.66 mg/dL (ref 0.44–1.00)
GFR calc Af Amer: >60 mL/min (ref >60)
GFR calc non Af Amer: >60 mL/min (ref >60)
Glucose, Bld: 104 mg/dL — ABNORMAL HIGH (ref 70–99)
POTASSIUM: 4.5 mmol/L (ref 3.5–5.1)
Sodium: 142 mmol/L (ref 135–145)
Total Bilirubin: 0.9 mg/dL (ref 0.3–1.2)
Total Protein: 5.8 g/dL — ABNORMAL LOW (ref 6.5–8.1)

## 2018-07-05 LAB — CBC WITH DIFFERENTIAL/PLATELET
Abs Immature Granulocytes: 0.03 10*3/uL (ref 0.00–0.07)
Basophils Absolute: 0 10*3/uL (ref 0.0–0.1)
Basophils Relative: 1 %
Eosinophils Absolute: 0.1 10*3/uL (ref 0.0–0.5)
Eosinophils Relative: 1 %
HCT: 37.2 % (ref 36.0–46.0)
Hemoglobin: 11.7 g/dL — ABNORMAL LOW (ref 12.0–15.0)
Immature Granulocytes: 0 %
Lymphocytes Relative: 31 %
Lymphs Abs: 2.5 10*3/uL (ref 0.7–4.0)
MCH: 29.6 pg (ref 26.0–34.0)
MCHC: 31.5 g/dL (ref 30.0–36.0)
MCV: 94.2 fL (ref 80.0–100.0)
Monocytes Absolute: 0.6 10*3/uL (ref 0.1–1.0)
Monocytes Relative: 8 %
Neutro Abs: 4.8 10*3/uL (ref 1.7–7.7)
Neutrophils Relative %: 59 %
Platelets: 241 10*3/uL (ref 150–400)
RBC: 3.95 MIL/uL (ref 3.87–5.11)
RDW: 11.9 % (ref 11.5–15.5)
WBC: 8 10*3/uL (ref 4.0–10.5)
nRBC: 0 % (ref 0.0–0.2)

## 2018-07-05 LAB — HIV ANTIBODY (ROUTINE TESTING W REFLEX): HIV Screen 4th Generation wRfx: NONREACTIVE

## 2018-07-05 LAB — HEMOGLOBIN A1C
Hgb A1c MFr Bld: 5.3 % (ref 4.8–5.6)
Mean Plasma Glucose: 105.41 mg/dL

## 2018-07-05 LAB — LIPID PANEL
Cholesterol: 134 mg/dL (ref 0–200)
HDL: 32 mg/dL — ABNORMAL LOW (ref >40)
LDL Cholesterol: 79 mg/dL (ref 0–99)
TRIGLYCERIDES: 117 mg/dL (ref <150)
Total CHOL/HDL Ratio: 4.2 RATIO
VLDL: 23 mg/dL (ref 0–40)

## 2018-07-05 LAB — C-REACTIVE PROTEIN

## 2018-07-05 LAB — PHOSPHORUS: Phosphorus: 1.9 mg/dL — ABNORMAL LOW (ref 2.5–4.6)

## 2018-07-05 LAB — SEDIMENTATION RATE: Sed Rate: 8 mm/hr (ref 0–22)

## 2018-07-05 LAB — MAGNESIUM: Magnesium: 1.9 mg/dL (ref 1.7–2.4)

## 2018-07-05 LAB — VITAMIN B12: Vitamin B-12: 167 pg/mL — ABNORMAL LOW (ref 180–914)

## 2018-07-05 MED ORDER — CYANOCOBALAMIN 1000 MCG/ML IJ SOLN
1000.0000 ug | Freq: Once | INTRAMUSCULAR | Status: AC
  Administered 2018-07-05: 1000 ug via INTRAMUSCULAR
  Filled 2018-07-05: qty 1

## 2018-07-05 MED ORDER — ATORVASTATIN CALCIUM 10 MG PO TABS
20.0000 mg | ORAL_TABLET | Freq: Every day | ORAL | Status: DC
  Administered 2018-07-05: 20 mg via ORAL
  Filled 2018-07-05: qty 2

## 2018-07-05 MED ORDER — VITAMIN B-12 1000 MCG PO TABS
1000.0000 ug | ORAL_TABLET | Freq: Every day | ORAL | Status: DC
  Administered 2018-07-06: 1000 ug via ORAL
  Filled 2018-07-05: qty 1

## 2018-07-05 MED ORDER — SODIUM CHLORIDE 0.9 % IV BOLUS
1000.0000 mL | Freq: Once | INTRAVENOUS | Status: AC
  Administered 2018-07-05: 1000 mL via INTRAVENOUS

## 2018-07-05 MED ORDER — POTASSIUM PHOSPHATES 15 MMOLE/5ML IV SOLN
20.0000 mmol | Freq: Once | INTRAVENOUS | Status: AC
  Administered 2018-07-05: 20 mmol via INTRAVENOUS
  Filled 2018-07-05: qty 6.67

## 2018-07-05 NOTE — Evaluation (Signed)
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   Clinical Impression  Pt is at or close to baseline functioning and should be safe at home alone and return to work per MD. There are no further acute PT needs.  Will sign off at this time.     Follow Up Recommendations No PT follow up    Equipment Recommendations  None recommended by PT    Recommendations for Other Services       Precautions / Restrictions Precautions Precautions: None Restrictions Weight Bearing Restrictions: No      Mobility  Bed Mobility Overal bed mobility: Independent             General bed mobility comments: no assist needed  Transfers Overall transfer level: Independent Equipment used: None             General transfer comment: No assist needed.   Ambulation/Gait Ambulation/Gait assistance: Independent Gait Distance (Feet): 700 Feet Assistive device: None Gait Pattern/deviations: WFL(Within Functional Limits) Gait velocity: age appropriate Gait velocity interpretation: >4.37 ft/sec, indicative of normal walking speed General Gait Details: pt showed no signs of functional deviation or other problem managing challenges to balance.  Stairs Stairs: Yes Stairs assistance: Independent Stair Management: No rails;Alternating pattern;Forwards Number of Stairs: 3(limited by lines) General stair comments: safe and fluid  Wheelchair Mobility    Modified Rankin (Stroke Patients Only) Modified Rankin (Stroke Patients Only) Pre-Morbid Rankin Score: No symptoms Modified Rankin: No symptoms     Balance Overall balance assessment: No apparent balance deficits (not formally assessed)                                           Pertinent Vitals/Pain Pain Assessment: No/denies pain    Home Living Family/patient expects to be discharged to:: Private residence Living Arrangements: Spouse/significant other Available Help at Discharge: Family;Available 24 hours/day Type of Home: House Home Access: Stairs to enter Entrance Stairs-Rails: None Entrance Stairs-Number of Steps: 3 Home Layout: One level Home Equipment: None      Prior Function Level of Independence: Independent         Comments: Pt teaches kindergarten     Hand Dominance   Dominant Hand: Left    Extremity/Trunk Assessment   Upper Extremity Assessment Upper Extremity Assessment: Overall WFL for tasks assessed    Lower Extremity Assessment Lower Extremity Assessment: Overall WFL for tasks assessed    Cervical / Trunk Assessment Cervical / Trunk Assessment: Normal  Communication   Communication: No difficulties  Cognition Arousal/Alertness: Awake/alert Behavior During Therapy: WFL for tasks assessed/performed Overall Cognitive Status: Within Functional Limits for tasks assessed                                 General Comments: intact      General Comments General comments (skin integrity, edema, etc.): Discussed BE FAST and risk factors    Exercises     Assessment/Plan    PT Assessment Patent does not need any further PT services  PT Problem List  PT Treatment Interventions      PT Goals (Current goals can be found in the Care Plan section)  Acute Rehab PT Goals Patient Stated Goal: to go home PT Goal Formulation: With patient    Frequency     Barriers to discharge        Co-evaluation               AM-PAC PT "6 Clicks" Mobility  Outcome Measure Help needed turning from your back to your side while in a flat bed without using bedrails?: None Help needed moving from lying on your back to sitting on the side of a flat bed without using  bedrails?: None Help needed moving to and from a bed to a chair (including a wheelchair)?: None Help needed standing up from a chair using your arms (e.g., wheelchair or bedside chair)?: None Help needed to walk in hospital room?: None Help needed climbing 3-5 steps with a railing? : None 6 Click Score: 24    End of Session   Activity Tolerance: Patient tolerated treatment well Patient left: in bed;with call bell/phone within reach;with family/visitor present Nurse Communication: Mobility status PT Visit Diagnosis: Unsteadiness on feet (R26.81)    Time: 4920-1007 PT Time Calculation (min) (ACUTE ONLY): 20 min   Charges:   PT Evaluation $PT Eval Low Complexity: 1 Low          07/05/2018   Bing, PT Acute Rehabilitation Services [phone]  (pager) [phone]  (office)  Eliseo Gum Zakaria Fromer 07/05/2018, 1:42 PM

## 2018-07-05 NOTE — Progress Notes (Addendum)
Nutrition Brief Note  RD consulted for assessment of nutrition requirements and status regarding CVA.  Wt Readings from Last 15 Encounters:  07/04/18 65.8 kg    Body mass index is 26.52 kg/m. Patient meets criteria for overweight based on current BMI.   Current diet order is heart healthy, patient is consuming approximately 85% of meals at this time. Pt reports having a good appetite currently and PTA with no other difficulties. Labs and medications reviewed.   No nutrition interventions warranted at this time. If nutrition issues arise, please consult RD.   Roslyn Smiling, MS, RD, LDN Pager # [phone] After hours/ weekend pager # [phone]

## 2018-07-05 NOTE — Progress Notes (Addendum)
STROKE TEAM PROGRESS NOTE   INTERVAL HISTORY Her husband and family is at the bedside.  Patient awake, alert, sitting up in chair. Contacted [facility]  For TEE request  Vitals:   07/05/18 0340 07/05/18 0500 07/05/18 0502 07/05/18 0747  BP: (!) 95/55 (!) 85/58 (!) 80/49 (!) 112/59  Pulse: 74 70    Resp: _0 Temp: 98 F (36.7 C) 98.1 F (36.7 C)  (!) 97.4 F (36.3 C)  TempSrc: Axillary Oral Oral Axillary  SpO2: 95% 98%  99%  Weight:      Height:        CBC:  Recent Labs  Lab 07/04/18 1216  WBC 7.8  NEUTROABS 5.4  HGB 13.1  HCT 42.1  MCV 94.4  PLT 021    Basic Metabolic Panel:  Recent Labs  Lab 07/04/18 1216  NA 140  K 3.7  CL 108  CO2 21*  GLUCOSE 154*  BUN 6  CREATININE 0.63  CALCIUM 9.1   Lipid Panel:     Component Value Date/Time   CHOL 134 07/05/2018 0616   TRIG 117 07/05/2018 0616   HDL 32 (L) 07/05/2018 0616   CHOLHDL 4.2 07/05/2018 0616   VLDL 23 07/05/2018 0616   LDLCALC 79 07/05/2018 0616   HgbA1c:  Lab Results  Component Value Date   HGBA1C 5.3 07/05/2018   Urine Drug Screen: No results found for: LABOPIA, COCAINSCRNUR, LABBENZ, AMPHETMU, THCU, LABBARB  Alcohol Level     Component Value Date/Time   ETH <10 07/04/2018 1216    IMAGING Ct Angio Head W Or Wo Contrast  Result Date: 07/04/2018 CLINICAL DATA:  Code stroke.  Aphasia.  Left-sided weakness. EXAM: CT ANGIOGRAPHY HEAD AND NECK TECHNIQUE: Multidetector CT imaging of the head and neck was performed using the standard protocol during bolus administration of intravenous contrast. Multiplanar CT image reconstructions and MIPs were obtained to evaluate the vascular anatomy. Carotid stenosis measurements (when applicable) are obtained utilizing NASCET criteria, using the distal internal carotid diameter as the denominator. CONTRAST:  57m ISOVUE-370 IOPAMIDOL (ISOVUE-370) INJECTION 76% COMPARISON:  MRI 07/04/2018 FINDINGS: CT HEAD FINDINGS Brain: No evidence of acute infarction,  hemorrhage, hydrocephalus, extra-axial collection or mass lesion/mass effect. Vascular: Negative for hyperdense vessel Skull: Negative Sinuses: Negative Orbits: Negative Review of the MIP images confirms the above findings CTA NECK FINDINGS Aortic arch: Normal aortic arch. Negative for dissection or atherosclerotic disease. Left vertebral artery origin from the arch Right carotid system: Normal right carotid without stenosis or irregularity. Left carotid system: Normal left carotid without stenosis or irregularity Vertebral arteries: Both vertebral arteries widely patent without stenosis or irregularity. Left vertebral artery origin from the aortic arch. Skeleton: Negative Other neck: Negative Upper chest: Negative Review of the MIP images confirms the above findings CTA HEAD FINDINGS Anterior circulation: Cavernous carotid normal bilaterally. Proximal anterior and middle cerebral arteries widely patent bilaterally without stenosis. Occluded branch of distal right MCA in the right parietal region. This corresponds to right M3/M4 occluded segment and corresponds to the area of restricted diffusion on MRI. This is likely a small embolus. No other branch occlusions. Posterior circulation: Both vertebral arteries widely patent to the basilar. PICA patent bilaterally. Basilar widely patent. AICA, superior cerebellar, and posterior cerebral arteries widely patent Venous sinuses: Patent Anatomic variants: None Delayed phase: Not performed Review of the MIP images confirms the above findings IMPRESSION: 1. Right M3/M4 branch occlusion in the right parietal lobe corresponding to acute infarct on MRI. This is presumably an  embolus from the heart. No other significant intracranial or extracranial disease. 2. These results were called by telephone at the time of interpretation on 07/04/2018 at 4:09 pm to Dr. Cheral Marker , who verbally acknowledged these results. Electronically Signed   By: Franchot Gallo M.D.   On: 07/04/2018 16:10    Ct Angio Neck W And/or Wo Contrast  Result Date: 07/04/2018 CLINICAL DATA:  Code stroke.  Aphasia.  Left-sided weakness. EXAM: CT ANGIOGRAPHY HEAD AND NECK TECHNIQUE: Multidetector CT imaging of the head and neck was performed using the standard protocol during bolus administration of intravenous contrast. Multiplanar CT image reconstructions and MIPs were obtained to evaluate the vascular anatomy. Carotid stenosis measurements (when applicable) are obtained utilizing NASCET criteria, using the distal internal carotid diameter as the denominator. CONTRAST:  12m ISOVUE-370 IOPAMIDOL (ISOVUE-370) INJECTION 76% COMPARISON:  MRI 07/04/2018 FINDINGS: CT HEAD FINDINGS Brain: No evidence of acute infarction, hemorrhage, hydrocephalus, extra-axial collection or mass lesion/mass effect. Vascular: Negative for hyperdense vessel Skull: Negative Sinuses: Negative Orbits: Negative Review of the MIP images confirms the above findings CTA NECK FINDINGS Aortic arch: Normal aortic arch. Negative for dissection or atherosclerotic disease. Left vertebral artery origin from the arch Right carotid system: Normal right carotid without stenosis or irregularity. Left carotid system: Normal left carotid without stenosis or irregularity Vertebral arteries: Both vertebral arteries widely patent without stenosis or irregularity. Left vertebral artery origin from the aortic arch. Skeleton: Negative Other neck: Negative Upper chest: Negative Review of the MIP images confirms the above findings CTA HEAD FINDINGS Anterior circulation: Cavernous carotid normal bilaterally. Proximal anterior and middle cerebral arteries widely patent bilaterally without stenosis. Occluded branch of distal right MCA in the right parietal region. This corresponds to right M3/M4 occluded segment and corresponds to the area of restricted diffusion on MRI. This is likely a small embolus. No other branch occlusions. Posterior circulation: Both vertebral arteries  widely patent to the basilar. PICA patent bilaterally. Basilar widely patent. AICA, superior cerebellar, and posterior cerebral arteries widely patent Venous sinuses: Patent Anatomic variants: None Delayed phase: Not performed Review of the MIP images confirms the above findings IMPRESSION: 1. Right M3/M4 branch occlusion in the right parietal lobe corresponding to acute infarct on MRI. This is presumably an embolus from the heart. No other significant intracranial or extracranial disease. 2. These results were called by telephone at the time of interpretation on 07/04/2018 at 4:09 pm to Dr. LCheral Marker, who verbally acknowledged these results. Electronically Signed   By: CFranchot GalloM.D.   On: 07/04/2018 16:10   Mr Brain Wo Contrast  Result Date: 07/04/2018 CLINICAL DATA:  Left arm weakness, aphasia. EXAM: MRI HEAD WITHOUT CONTRAST TECHNIQUE: Multiplanar, multiecho pulse sequences of the brain and surrounding structures were obtained without intravenous contrast. COMPARISON:  MRI head 06/26/2003 FINDINGS: Brain: Restricted diffusion in the right parietal lobe compatible with acute infarct. This involves region of the motor strep as well as the high medial parietal lobe. Small area of acute infarct extends into the posterior insula on the right. FLAIR imaging negative for edema. Ventricle size normal. No significant chronic ischemia. Negative for mass lesion. Vascular: Small area of susceptibility in the right parietal lobe probable area of right MCA clot. There appears to be increased signal in the right parietal branch in this area suggesting slow flow. Skull and upper cervical spine: Negative Sinuses/Orbits: Negative Other: Mild IMPRESSION: Acute infarct posterior right MCA territory involving the posterior insula and right parietal lobe in the region of the motor  cortex. Negative FLAIR imaging. Probable clot in distal right MCA branch versus focal microhemorrhage. Increased flow in this vessel on FLAIR  suggests slow flow. These results were called by telephone at the time of interpretation on 07/04/2018 at 3:00 pm to Dr. Gilford Raid, who verbally acknowledged these results. Electronically Signed   By: Franchot Gallo M.D.   On: 07/04/2018 15:01    PHYSICAL EXAM   Constitutional: Appears well-developed and well-nourished.  Eyes: No scleral injection HENT: No OP obstrucion Head: Normocephalic.  Cardiovascular: Normal rate and regular rhythm.  Respiratory: Effort normal, non-labored breathing on RA GI: Soft.  No distension. There is no tenderness.  Skin: WDI  Neurological Evaluation: Mental Status: Patient is awake, alert, oriented to person, place, month, year, and situation. Patient is able to give a clear and coherent history. No signs of aphasia or neglect Cranial Nerves: II: Visual Fields are full.   III,IV, VI: EOMI without ptosis or diplopia.  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  Pupils are equal, round, and reactive to light. V: Facial sensation is symmetric to temperature VII: Facial movement is symmetric.  VIII: hearing is intact to voice X: No hypophonia XI: Shoulder shrug is symmetric. XII: tongue is midline without atrophy or fasciculations.  Motor: Tone is normal. Bulk is normal. 5/5 strength was present in all four extremities.  Sensory: Sensation is symmetric to light touch  in the arms and legs. Deep Tendon Reflexes: 2+ and symmetric in the biceps and patellae.  Plantars: Toes are downgoing bilaterally.  Cerebellar: FNF and HKS are intact bilaterally  ASSESSMENT/PLAN Ms. Meredeth Furber is a 48 y.o. female with history of anxiety presenting with word finding difficulty and weakness of the left arm.   She also noted persistent weakness in her left arm in which she was dropping things and could not pick them up.  This lasted for about 45 minutes. By the time she got to the hospital all her symptoms had resolved.  MRI did show an acute infarct  in the posterior right MCA territory involving the posterior insula and right parietal lobe in the region of the motor cortex.  Code stroke was called.  At time of arrival all symptoms had resolved. 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 This is presumably an embolus from the heart. No other significant intracranial or extracranial disease.  MRI  Acute infarct posterior right MCA territory involving the posterior insula and right parietal lobe in the region of the motor cortex. Negative FLAIR imaging. Probable clot in distal right MCA branch versus focal microhemorrhage. Increased flow in this vessel on FLAIR suggests slow flow  TEE  Pending  LE dopplers:Right: There is no evidence of deep vein thrombosis in the lower extremity. No cystic structure found in the popliteal fossa.  Left: There is no evidence of deep vein thrombosis in the lower extremity. No cystic structure found in the popliteal fossa.  LDL 79  HgbA1c 5.3  Lovenox for VTE prophylaxis  No antithrombotic prior to admission, now on aspirin 325 mg daily.   Therapy recommendations:  none  Disposition:  pending  Hypertension  Stable . Permissive hypertension (OK if < 220/120) but gradually normalize in 5-7 days . Long-term BP goal normotensive  Hyperlipidemia  Home meds:  none,   LDL 79 , goal < 70  Add atorvastatin 28m  Continue statin at discharge  Diabetes type II  HgbA1c 5.3, goal < 7.0  Controlled  Other Stroke Risk Factors  Overweight, Body mass index is 26.52 kg/m., recommend weight loss, diet and exercise as appropriate   Family hx stroke (mother)   Other Active Problems  [facility]  JLaurey Morale MSN, NP-C [facility] [phone] ATTENDING NOTE: I reviewed above note and  agree with the assessment and plan. Pt was seen and examined.   48year old female with history of anxiety admitted for transient left arm weakness and expressive aphasia.  Symptoms lasted about 2 to 3 hours and resolved.  MRI showed right insular cortex, right MCA and ACA patchy infarcts.  CTA head and neck showed right M3/M4 branch occlusion, concerning for cardioembolic source.  LE venous Doppler negative for DVT.  2D echo pending.  Hypercoagulable work-up pending.  LDL 79 A1c 5.3, ESR and CRP within normal limits.  B12 167.  TEE and loop recorder pending.  On examination, patient neurologically intact, no neuro deficit.  She stated that once a while she will have heart palpitation, feels like heart fluttering especially at night before sleep.  Given current stroke and heart palpitation, will recommend TEE and loop recorder, which has been ordered, hopefully to be done tomorrow Friday.  B12 was low, given B12 supplement.  Continue aspirin 305 and statin for stroke prevention.  Will follow.  JRosalin Hawking MD PhD Stroke Neurology 07/05/2018 2:28 PM     To contact Stroke Continuity provider, please refer to [email or web address] After hours, contact General Neurology

## 2018-07-05 NOTE — Plan of Care (Addendum)
Patient stable, discussed POC with patient, agreeable with plan, IV bolus tolerated well, denies question/concerns at this time.

## 2018-07-05 NOTE — Progress Notes (Signed)
Pt Hypotensive, BP trending down, pt is asymptomatic. Notified [facility] provider. Jorge Ny

## 2018-07-05 NOTE — Progress Notes (Signed)
PROGRESS NOTE    Krystal Oneal  MRN:[id number] DOB: 06/21/1970 DOA: 07/04/2018 PCP: Whyte, Thomas M, MD   Brief Narrative:  HPI per Dr. Jennifer Yates on 07/04/2018 Krystal Oneal is a 48 y.o. female with medical history significant of anxiety presenting with dizziness and near syncope.  She got dizzy and nauseated with slurred speech and dyscoordination.  She was fine this AM until about 10AM.  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  She was dizzy and unable to talk.  She finally told him to go back to his desk.  She was dropping things on the floor and the teacher came to check on her.  She was talking nonsense.  She did not have difficulty walking.  Her symptoms lasted through arrival in the ER, still having dysarthria.  Symptoms finally improved about 2-3pm.  Now she feels very tired and sleepy.     ED Course:  No risk factors, had a stroke today.  Last known normal was 10AM.  Neurology is following - back to baseline now but she is an IR candidate until 10AM tomorrow so if symptoms recur please reconsult neuro.  Neuro will order an array of labs given lack of RF and young age.  **Undergoing full stroke work-up and TEE to be done tomorrow more likely.  Hypercoagulable work-up is also been initiated by neurology at this time.  Assessment & Plan:   Principal Problem:   CVA (cerebral vascular accident) (HCC) Active Problems:   Anxiety  Acute Right Posterior MCA CVA -Patient without known CVD RF presenting with acute onset of neurologic symptoms this AM concerning for CVA -Head CTA Head and Neck showed Right M3/M4 branch occlusion in the right parietal lobe corresponding to acute infarct on MRI. This is presumably an embolus from the heart. No other significant intracranial or extracranial disease. -MRI confirms posterior right MCA CVA likely embolic 2/2 small vessel disease -Symptoms have since improved  -Admitted for further CVA evaluation -C/w  Telemetry monitoring -Echo with bubble study done and pending read; Neurology recommending TEE and Loop Recorder and have recommending ASA 325 mg po Daily and Atorvastatin 80 mg po Daily -LE dopplers showed no evidence of DVT -Risk stratification with FLP, A1c; will also check TSH and UDS -HbA1c was 5.3, TSH was 1.284, and UDS not done yet  -Lipid Panel showed a level of 134, HDL 32, LDL 79, triglycerides 117, and VLDL of 23 -Neurology consulted and and appreciate further evaluation and recommendations  -PT/OT/ST/Nutrition Consults recommending no Follow up -No known h/o HTN but will monitor and allow permissive HTN for now; current BPs are low normal -Started Lipitor 80 mg daily but was changed to Atorvastatin 20 mg po Daily by Neurology  -Allow for Permissive HTN -No known h/o DM - checked A1c and was 5.3 and monitor -Hypercoagulable workup initiated -If her symptoms recur, she remains an IR candidate through 10AM tomorrow.  The patient and her family have been informed to monitor carefully for development of symptoms and notify nursing staff immediately if symptoms recur.  Hypophosphatemia  -Patient's Phos Level this AM was 1.9 -Replete with IV KPhos 20 mmol -Continue to Monitor and Replete as Necessary -Repeat Phos Level   Anxiety -This is her only know medical problem and she takes prn Klonopin qhs for this issue -C/w Clonazepam 0.5 mg po qHS  B12 Deficiency -B12 Level was 167 -Neurology started Supplementation and gave a Cyanocobalamin 1000 mcg IM once  Overweight -  Estimated body mass index is 26.52 kg/m as calculated from the following:   Height as of this encounter: 5' 2" (1.575 m).   Weight as of this encounter: 65.8 kg. -Weight Loss Counseling given   DVT prophylaxis: Enoxaparin 40 mg sq q24h Code Status: FULL CODE Family Communication: Discussed with Husband at bedside Disposition Plan: Pending further Neurological Workup; Likely Home in the next 24-48 hours    Consultants:   Neurology   Procedures:  Bilateral LE Venous Duplex Right: There is no evidence of deep vein thrombosis in the lower extremity. No cystic structure found in the popliteal fossa. Left: There is no evidence of deep vein thrombosis in the lower extremity. No cystic structure found in the popliteal fossa.   ECHOCARDIOGRAM (ordered and pending read)   Antimicrobials:  Anti-infectives (From admission, onward)   None     Subjective: Seen and examined at bedside and she is doing better and states that her symptoms have improved.  No chest pain, lightheadedness or dizziness.  No nausea or vomiting.  No other concerns or complaints at this time and thinks that her symptoms are improved.  Objective: Vitals:   07/05/18 0502 07/05/18 0747 07/05/18 1155 07/05/18 1311  BP: (!) 80/49 (!) 112/59 113/68 (!) 103/53  Pulse:   65 71  Resp:  18 18 18  Temp:  (!) 97.4 F (36.3 C) 98.5 F (36.9 C) 98.3 F (36.8 C)  TempSrc: Oral Axillary Oral Oral  SpO2:  99% 97% 98%  Weight:      Height:        Intake/Output Summary (Last 24 hours) at 07/05/2018 1525 Last data filed at 07/05/2018 1156 Gross per 24 hour  Intake 240 ml  Output -  Net 240 ml   Filed Weights   07/04/18 1147  Weight: 65.8 kg   Examination: Physical Exam:  Constitutional: WN/WD, overweight Caucasian female in NAD and appears calm and comfortable Eyes: Lids and conjunctivae normal, sclerae anicteric  ENMT: External Ears, Nose appear normal. Grossly normal hearing. Mucous membranes are moist.  Neck: Appears normal, supple, no cervical masses, normal ROM, no appreciable thyromegaly; no JVD Respiratory: Diminished to auscultation bilaterally, no wheezing, rales, rhonchi or crackles. Normal respiratory effort and patient is not tachypenic. No accessory muscle use.  Cardiovascular: RRR, no murmurs / rubs / gallops. S1 and S2 auscultated. No extremity edema.  Abdomen: Soft, non-tender, non-distended. No masses  palpated. No appreciable hepatosplenomegaly. Bowel sounds positive.  GU: Deferred. Musculoskeletal: No clubbing / cyanosis of digits/nails. No joint deformity upper and lower extremities.  Skin: No rashes, lesions, ulcers on a limited skin evaluation. No induration; Warm and dry.  Neurologic: CN 2-12 grossly intact with no focal deficits.  Romberg sign and cerebellar reflexes not assessed. Had some weakness in upper arm Psychiatric: Normal judgment and insight. Alert and oriented x 3. Normal mood and appropriate affect.   Data Reviewed: I have personally reviewed following labs and imaging studies  CBC: Recent Labs  Lab 07/04/18 1216 07/05/18 0946  WBC 7.8 8.0  NEUTROABS 5.4 4.8  HGB 13.1 11.7*  HCT 42.1 37.2  MCV 94.4 94.2  PLT 260 241   Basic Metabolic Panel: Recent Labs  Lab 07/04/18 1216 07/05/18 0946  NA 140 142  K 3.7 4.5  CL 108 112*  CO2 21* 23  GLUCOSE 154* 104*  BUN 6 <5*  CREATININE 0.63 0.66  CALCIUM 9.1 8.3*  MG  --  1.9  PHOS  --  1.9*   GFR: Estimated   Creatinine Clearance: 78.2 mL/min (by C-G formula based on SCr of 0.66 mg/dL). Liver Function Tests: Recent Labs  Lab 07/04/18 1216 07/05/18 0946  AST 21 18  ALT 18 13  ALKPHOS 66 56  BILITOT 0.9 0.9  PROT 6.6 5.8*  ALBUMIN 4.0 3.3*   No results for input(s): LIPASE, AMYLASE in the last 168 hours. No results for input(s): AMMONIA in the last 168 hours. Coagulation Profile: Recent Labs  Lab 07/04/18 1216  INR 0.99   Cardiac Enzymes: No results for input(s): CKTOTAL, CKMB, CKMBINDEX, TROPONINI in the last 168 hours. BNP (last 3 results) No results for input(s): PROBNP in the last 8760 hours. HbA1C: Recent Labs    07/05/18 0616  HGBA1C 5.3   CBG: No results for input(s): GLUCAP in the last 168 hours. Lipid Profile: Recent Labs    07/05/18 0616  CHOL 134  HDL 32*  LDLCALC 79  TRIG 117  CHOLHDL 4.2   Thyroid Function Tests: Recent Labs    07/04/18 1750  TSH 1.284   Anemia  Panel: Recent Labs    07/05/18 0616  VITAMINB12 167*   Sepsis Labs: No results for input(s): PROCALCITON, LATICACIDVEN in the last 168 hours.  No results found for this or any previous visit (from the past 240 hour(s)).   Radiology Studies: Ct Angio Head W Or Wo Contrast  Result Date: 07/04/2018 CLINICAL DATA:  Code stroke.  Aphasia.  Left-sided weakness. EXAM: CT ANGIOGRAPHY HEAD AND NECK TECHNIQUE: Multidetector CT imaging of the head and neck was performed using the standard protocol during bolus administration of intravenous contrast. Multiplanar CT image reconstructions and MIPs were obtained to evaluate the vascular anatomy. Carotid stenosis measurements (when applicable) are obtained utilizing NASCET criteria, using the distal internal carotid diameter as the denominator. CONTRAST:  75mL ISOVUE-370 IOPAMIDOL (ISOVUE-370) INJECTION 76% COMPARISON:  MRI 07/04/2018 FINDINGS: CT HEAD FINDINGS Brain: No evidence of acute infarction, hemorrhage, hydrocephalus, extra-axial collection or mass lesion/mass effect. Vascular: Negative for hyperdense vessel Skull: Negative Sinuses: Negative Orbits: Negative Review of the MIP images confirms the above findings CTA NECK FINDINGS Aortic arch: Normal aortic arch. Negative for dissection or atherosclerotic disease. Left vertebral artery origin from the arch Right carotid system: Normal right carotid without stenosis or irregularity. Left carotid system: Normal left carotid without stenosis or irregularity Vertebral arteries: Both vertebral arteries widely patent without stenosis or irregularity. Left vertebral artery origin from the aortic arch. Skeleton: Negative Other neck: Negative Upper chest: Negative Review of the MIP images confirms the above findings CTA HEAD FINDINGS Anterior circulation: Cavernous carotid normal bilaterally. Proximal anterior and middle cerebral arteries widely patent bilaterally without stenosis. Occluded branch of distal right MCA in  the right parietal region. This corresponds to right M3/M4 occluded segment and corresponds to the area of restricted diffusion on MRI. This is likely a small embolus. No other branch occlusions. Posterior circulation: Both vertebral arteries widely patent to the basilar. PICA patent bilaterally. Basilar widely patent. AICA, superior cerebellar, and posterior cerebral arteries widely patent Venous sinuses: Patent Anatomic variants: None Delayed phase: Not performed Review of the MIP images confirms the above findings IMPRESSION: 1. Right M3/M4 branch occlusion in the right parietal lobe corresponding to acute infarct on MRI. This is presumably an embolus from the heart. No other significant intracranial or extracranial disease. 2. These results were called by telephone at the time of interpretation on 07/04/2018 at 4:09 pm to Dr. Lindzen , who verbally acknowledged these results. Electronically Signed   By: Charles    Clark M.D.   On: 07/04/2018 16:10   Ct Angio Neck W And/or Wo Contrast  Result Date: 07/04/2018 CLINICAL DATA:  Code stroke.  Aphasia.  Left-sided weakness. EXAM: CT ANGIOGRAPHY HEAD AND NECK TECHNIQUE: Multidetector CT imaging of the head and neck was performed using the standard protocol during bolus administration of intravenous contrast. Multiplanar CT image reconstructions and MIPs were obtained to evaluate the vascular anatomy. Carotid stenosis measurements (when applicable) are obtained utilizing NASCET criteria, using the distal internal carotid diameter as the denominator. CONTRAST:  75mL ISOVUE-370 IOPAMIDOL (ISOVUE-370) INJECTION 76% COMPARISON:  MRI 07/04/2018 FINDINGS: CT HEAD FINDINGS Brain: No evidence of acute infarction, hemorrhage, hydrocephalus, extra-axial collection or mass lesion/mass effect. Vascular: Negative for hyperdense vessel Skull: Negative Sinuses: Negative Orbits: Negative Review of the MIP images confirms the above findings CTA NECK FINDINGS Aortic arch: Normal aortic  arch. Negative for dissection or atherosclerotic disease. Left vertebral artery origin from the arch Right carotid system: Normal right carotid without stenosis or irregularity. Left carotid system: Normal left carotid without stenosis or irregularity Vertebral arteries: Both vertebral arteries widely patent without stenosis or irregularity. Left vertebral artery origin from the aortic arch. Skeleton: Negative Other neck: Negative Upper chest: Negative Review of the MIP images confirms the above findings CTA HEAD FINDINGS Anterior circulation: Cavernous carotid normal bilaterally. Proximal anterior and middle cerebral arteries widely patent bilaterally without stenosis. Occluded branch of distal right MCA in the right parietal region. This corresponds to right M3/M4 occluded segment and corresponds to the area of restricted diffusion on MRI. This is likely a small embolus. No other branch occlusions. Posterior circulation: Both vertebral arteries widely patent to the basilar. PICA patent bilaterally. Basilar widely patent. AICA, superior cerebellar, and posterior cerebral arteries widely patent Venous sinuses: Patent Anatomic variants: None Delayed phase: Not performed Review of the MIP images confirms the above findings IMPRESSION: 1. Right M3/M4 branch occlusion in the right parietal lobe corresponding to acute infarct on MRI. This is presumably an embolus from the heart. No other significant intracranial or extracranial disease. 2. These results were called by telephone at the time of interpretation on 07/04/2018 at 4:09 pm to Dr. Lindzen , who verbally acknowledged these results. Electronically Signed   By: Charles  Clark M.D.   On: 07/04/2018 16:10   Mr Brain Wo Contrast  Result Date: 07/04/2018 CLINICAL DATA:  Left arm weakness, aphasia. EXAM: MRI HEAD WITHOUT CONTRAST TECHNIQUE: Multiplanar, multiecho pulse sequences of the brain and surrounding structures were obtained without intravenous contrast.  COMPARISON:  MRI head 06/26/2003 FINDINGS: Brain: Restricted diffusion in the right parietal lobe compatible with acute infarct. This involves region of the motor strep as well as the high medial parietal lobe. Small area of acute infarct extends into the posterior insula on the right. FLAIR imaging negative for edema. Ventricle size normal. No significant chronic ischemia. Negative for mass lesion. Vascular: Small area of susceptibility in the right parietal lobe probable area of right MCA clot. There appears to be increased signal in the right parietal branch in this area suggesting slow flow. Skull and upper cervical spine: Negative Sinuses/Orbits: Negative Other: Mild IMPRESSION: Acute infarct posterior right MCA territory involving the posterior insula and right parietal lobe in the region of the motor cortex. Negative FLAIR imaging. Probable clot in distal right MCA branch versus focal microhemorrhage. Increased flow in this vessel on FLAIR suggests slow flow. These results were called by telephone at the time of interpretation on 07/04/2018 at 3:00 pm to Dr. Haviland, who   verbally acknowledged these results. Electronically Signed   By: Charles  Clark M.D.   On: 07/04/2018 15:01   Vas Us Lower Extremity Venous (dvt)  Result Date: 07/05/2018  Lower Venous Study Indications: Stroke.  Performing Technologist: Megan Riddle RVS  Examination Guidelines: A complete evaluation includes B-mode imaging, spectral Doppler, color Doppler, and power Doppler as needed of all accessible portions of each vessel. Bilateral testing is considered an integral part of a complete examination. Limited examinations for reoccurring indications may be performed as noted.  Right Venous Findings: +---------+---------------+---------+-----------+----------+-------+          CompressibilityPhasicitySpontaneityPropertiesSummary +---------+---------------+---------+-----------+----------+-------+ CFV      Full           Yes       Yes                          +---------+---------------+---------+-----------+----------+-------+ SFJ      Full                                                 +---------+---------------+---------+-----------+----------+-------+ FV Prox  Full                                                 +---------+---------------+---------+-----------+----------+-------+ FV Mid   Full                                                 +---------+---------------+---------+-----------+----------+-------+ FV DistalFull                                                 +---------+---------------+---------+-----------+----------+-------+ PFV      Full                                                 +---------+---------------+---------+-----------+----------+-------+ POP      Full           Yes      Yes                          +---------+---------------+---------+-----------+----------+-------+ PTV      Full                                                 +---------+---------------+---------+-----------+----------+-------+ PERO     Full                                                 +---------+---------------+---------+-----------+----------+-------+  Left Venous Findings: +---------+---------------+---------+-----------+----------+-------+            CompressibilityPhasicitySpontaneityPropertiesSummary +---------+---------------+---------+-----------+----------+-------+ CFV      Full           Yes      Yes                          +---------+---------------+---------+-----------+----------+-------+ SFJ      Full                                                 +---------+---------------+---------+-----------+----------+-------+ FV Prox  Full                                                 +---------+---------------+---------+-----------+----------+-------+ FV Mid   Full                                                  +---------+---------------+---------+-----------+----------+-------+ FV DistalFull                                                 +---------+---------------+---------+-----------+----------+-------+ PFV      Full                                                 +---------+---------------+---------+-----------+----------+-------+ POP      Full           Yes      Yes                          +---------+---------------+---------+-----------+----------+-------+ PTV      Full                                                 +---------+---------------+---------+-----------+----------+-------+ PERO     Full                                                 +---------+---------------+---------+-----------+----------+-------+    Summary: Right: There is no evidence of deep vein thrombosis in the lower extremity. No cystic structure found in the popliteal fossa. Left: There is no evidence of deep vein thrombosis in the lower extremity. No cystic structure found in the popliteal fossa.  *See table(s) above for measurements and observations.    Preliminary    Scheduled Meds: . aspirin  300 mg Rectal Daily   Or  . aspirin  325 mg Oral Daily  . atorvastatin  20 mg Oral q1800  . clonazePAM  0.5 mg Oral QHS  . enoxaparin (LOVENOX) injection  40 mg Subcutaneous Q24H  . loratadine  10 mg Oral Daily  . [  START ON 07/06/2018] vitamin B-12  1,000 mcg Oral Daily   Continuous Infusions: . sodium chloride Stopped (07/05/18 1204)    LOS: 1 day   Gracee Ratterree Latif Perfecto Purdy, DO [facility] PAGER is on AMION  If 7PM-7AM, please contact night-coverage [email or web address] Password [email or web address] 07/05/2018, 3:25 PM  

## 2018-07-05 NOTE — H&P (View-Only) (Signed)
PROGRESS NOTE    Krystal Oneal  AVW:[id number] DOB: Nov 15, 1970 DOA: 07/04/2018 PCP: Everlean CherryWhyte, Thomas M, MD   Brief Narrative:  HPI per Dr. Jonah BlueJennifer Yates on 07/04/2018 Krystal Oneal is a 48 y.o. female with medical history significant of anxiety presenting with dizziness and near syncope.  She got dizzy and nauseated with slurred speech and dyscoordination.  She was fine this AM until about 10AM.  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  She was dizzy and unable to talk.  She finally told him to go back to his desk.  She was dropping things on the floor and the teacher came to check on her.  She was talking nonsense.  She did not have difficulty walking.  Her symptoms lasted through arrival in the ER, still having dysarthria.  Symptoms finally improved about 2-3pm.  Now she feels very tired and sleepy.     ED Course:  No risk factors, had a stroke today.  Last known normal was 10AM.  Neurology is following - back to baseline now but she is an IR candidate until 10AM tomorrow so if symptoms recur please reconsult neuro.  Neuro will order an array of labs given lack of RF and young age.  **Undergoing full stroke work-up and TEE to be done tomorrow more likely.  Hypercoagulable work-up is also been initiated by neurology at this time.  Assessment & Plan:   Principal Problem:   CVA (cerebral vascular accident) [facility](HCC) Active Problems:   Anxiety  Acute Right Posterior MCA CVA -Patient without known CVD RF presenting with acute onset of neurologic symptoms this AM concerning for CVA -Head CTA Head and Neck showed Right M3/M4 branch occlusion in the right parietal lobe corresponding to acute infarct on MRI. This is presumably an embolus from the heart. No other significant intracranial or extracranial disease. -MRI confirms posterior right MCA CVA likely embolic 2/2 small vessel disease -Symptoms have since improved  -Admitted for further CVA evaluation -C/w  Telemetry monitoring -Echo with bubble study done and pending read; Neurology recommending TEE and Loop Recorder and have recommending ASA 325 mg po Daily and Atorvastatin 80 mg po Daily -LE dopplers showed no evidence of DVT -Risk stratification with FLP, A1c; will also check TSH and UDS -HbA1c was 5.3, TSH was 1.284, and UDS not done yet  -Lipid Panel showed a level of 134, HDL 32, LDL 79, triglycerides 117, and VLDL of 23 -Neurology consulted and and appreciate further evaluation and recommendations  -PT/OT/ST/Nutrition Consults recommending no Follow up -No known h/o HTN but will monitor and allow permissive HTN for now; current BPs are low normal -Started Lipitor 80 mg daily but was changed to Atorvastatin 20 mg po Daily by Neurology  -Allow for Permissive HTN -No known h/o DM - checked A1c and was 5.3 and monitor -Hypercoagulable workup initiated -If her symptoms recur, she remains an IR candidate through 10AM tomorrow.  The patient and her family have been informed to monitor carefully for development of symptoms and notify nursing staff immediately if symptoms recur.  Hypophosphatemia  -Patient's Phos Level this AM was 1.9 -Replete with IV KPhos 20 mmol -Continue to Monitor and Replete as Necessary -Repeat Phos Level   Anxiety -This is her only know medical problem and she takes prn Klonopin qhs for this issue -C/w Clonazepam 0.5 mg po qHS  B12 Deficiency -B12 Level was 167 -Neurology started Supplementation and gave a Cyanocobalamin 1000 mcg IM once  Overweight -  Estimated body mass index is 26.52 kg/m as calculated from the following:   Height as of this encounter: 5\' 2"  (1.575 m).   Weight as of this encounter: 65.8 kg. -Weight Loss Counseling given   DVT prophylaxis: Enoxaparin 40 mg sq q24h Code Status: FULL CODE Family Communication: Discussed with Husband at bedside Disposition Plan: Pending further Neurological Workup; Likely Home in the next 24-48 hours    Consultants:   Neurology   Procedures:  Bilateral LE Venous Duplex Right: There is no evidence of deep vein thrombosis in the lower extremity. No cystic structure found in the popliteal fossa. Left: There is no evidence of deep vein thrombosis in the lower extremity. No cystic structure found in the popliteal fossa.   ECHOCARDIOGRAM (ordered and pending read)   Antimicrobials:  Anti-infectives (From admission, onward)   None     Subjective: Seen and examined at bedside and she is doing better and states that her symptoms have improved.  No chest pain, lightheadedness or dizziness.  No nausea or vomiting.  No other concerns or complaints at this time and thinks that her symptoms are improved.  Objective: Vitals:   07/05/18 0502 07/05/18 0747 07/05/18 1155 07/05/18 1311  BP: (!) 80/49 (!) 112/59 113/68 (!) 103/53  Pulse:   65 71  Resp:  18 18 18   Temp:  (!) 97.4 F (36.3 C) 98.5 F (36.9 C) 98.3 F (36.8 C)  TempSrc: Oral Axillary Oral Oral  SpO2:  99% 97% 98%  Weight:      Height:        Intake/Output Summary (Last 24 hours) at 07/05/2018 1525 Last data filed at 07/05/2018 1156 Gross per 24 hour  Intake 240 ml  Output -  Net 240 ml   Filed Weights   07/04/18 1147  Weight: 65.8 kg   Examination: Physical Exam:  Constitutional: WN/WD, overweight Caucasian female in NAD and appears calm and comfortable Eyes: Lids and conjunctivae normal, sclerae anicteric  ENMT: External Ears, Nose appear normal. Grossly normal hearing. Mucous membranes are moist.  Neck: Appears normal, supple, no cervical masses, normal ROM, no appreciable thyromegaly; no JVD Respiratory: Diminished to auscultation bilaterally, no wheezing, rales, rhonchi or crackles. Normal respiratory effort and patient is not tachypenic. No accessory muscle use.  Cardiovascular: RRR, no murmurs / rubs / gallops. S1 and S2 auscultated. No extremity edema.  Abdomen: Soft, non-tender, non-distended. No masses  palpated. No appreciable hepatosplenomegaly. Bowel sounds positive.  GU: Deferred. Musculoskeletal: No clubbing / cyanosis of digits/nails. No joint deformity upper and lower extremities.  Skin: No rashes, lesions, ulcers on a limited skin evaluation. No induration; Warm and dry.  Neurologic: CN 2-12 grossly intact with no focal deficits.  Romberg sign and cerebellar reflexes not assessed. Had some weakness in upper arm Psychiatric: Normal judgment and insight. Alert and oriented x 3. Normal mood and appropriate affect.   Data Reviewed: I have personally reviewed following labs and imaging studies  CBC: Recent Labs  Lab 07/04/18 1216 07/05/18 0946  WBC 7.8 8.0  NEUTROABS 5.4 4.8  HGB 13.1 11.7*  HCT 42.1 37.2  MCV 94.4 94.2  PLT 260 241   Basic Metabolic Panel: Recent Labs  Lab 07/04/18 1216 07/05/18 0946  NA 140 142  K 3.7 4.5  CL 108 112*  CO2 21* 23  GLUCOSE 154* 104*  BUN 6 <5*  CREATININE 0.63 0.66  CALCIUM 9.1 8.3*  MG  --  1.9  PHOS  --  1.9*   GFR: Estimated  Creatinine Clearance: 78.2 mL/min (by C-G formula based on SCr of 0.66 mg/dL). Liver Function Tests: Recent Labs  Lab 07/04/18 1216 07/05/18 0946  AST 21 18  ALT 18 13  ALKPHOS 66 56  BILITOT 0.9 0.9  PROT 6.6 5.8*  ALBUMIN 4.0 3.3*   No results for input(s): LIPASE, AMYLASE in the last 168 hours. No results for input(s): AMMONIA in the last 168 hours. Coagulation Profile: Recent Labs  Lab 07/04/18 1216  INR 0.99   Cardiac Enzymes: No results for input(s): CKTOTAL, CKMB, CKMBINDEX, TROPONINI in the last 168 hours. BNP (last 3 results) No results for input(s): PROBNP in the last 8760 hours. HbA1C: Recent Labs    07/05/18 0616  HGBA1C 5.3   CBG: No results for input(s): GLUCAP in the last 168 hours. Lipid Profile: Recent Labs    07/05/18 0616  CHOL 134  HDL 32*  LDLCALC 79  TRIG 161  CHOLHDL 4.2   Thyroid Function Tests: Recent Labs    07/04/18 1750  TSH 1.284   Anemia  Panel: Recent Labs    07/05/18 0616  VITAMINB12 167*   Sepsis Labs: No results for input(s): PROCALCITON, LATICACIDVEN in the last 168 hours.  No results found for this or any previous visit (from the past 240 hour(s)).   Radiology Studies: Ct Angio Head W Or Wo Contrast  Result Date: 07/04/2018 CLINICAL DATA:  Code stroke.  Aphasia.  Left-sided weakness. EXAM: CT ANGIOGRAPHY HEAD AND NECK TECHNIQUE: Multidetector CT imaging of the head and neck was performed using the standard protocol during bolus administration of intravenous contrast. Multiplanar CT image reconstructions and MIPs were obtained to evaluate the vascular anatomy. Carotid stenosis measurements (when applicable) are obtained utilizing NASCET criteria, using the distal internal carotid diameter as the denominator. CONTRAST:  75mL ISOVUE-370 IOPAMIDOL (ISOVUE-370) INJECTION 76% COMPARISON:  MRI 07/04/2018 FINDINGS: CT HEAD FINDINGS Brain: No evidence of acute infarction, hemorrhage, hydrocephalus, extra-axial collection or mass lesion/mass effect. Vascular: Negative for hyperdense vessel Skull: Negative Sinuses: Negative Orbits: Negative Review of the MIP images confirms the above findings CTA NECK FINDINGS Aortic arch: Normal aortic arch. Negative for dissection or atherosclerotic disease. Left vertebral artery origin from the arch Right carotid system: Normal right carotid without stenosis or irregularity. Left carotid system: Normal left carotid without stenosis or irregularity Vertebral arteries: Both vertebral arteries widely patent without stenosis or irregularity. Left vertebral artery origin from the aortic arch. Skeleton: Negative Other neck: Negative Upper chest: Negative Review of the MIP images confirms the above findings CTA HEAD FINDINGS Anterior circulation: Cavernous carotid normal bilaterally. Proximal anterior and middle cerebral arteries widely patent bilaterally without stenosis. Occluded branch of distal right MCA in  the right parietal region. This corresponds to right M3/M4 occluded segment and corresponds to the area of restricted diffusion on MRI. This is likely a small embolus. No other branch occlusions. Posterior circulation: Both vertebral arteries widely patent to the basilar. PICA patent bilaterally. Basilar widely patent. AICA, superior cerebellar, and posterior cerebral arteries widely patent Venous sinuses: Patent Anatomic variants: None Delayed phase: Not performed Review of the MIP images confirms the above findings IMPRESSION: 1. Right M3/M4 branch occlusion in the right parietal lobe corresponding to acute infarct on MRI. This is presumably an embolus from the heart. No other significant intracranial or extracranial disease. 2. These results were called by telephone at the time of interpretation on 07/04/2018 at 4:09 pm to Dr. Otelia Limes , who verbally acknowledged these results. Electronically Signed   By: Leonette Most  Chestine Sporelark M.D.   On: 07/04/2018 16:10   Ct Angio Neck W And/or Wo Contrast  Result Date: 07/04/2018 CLINICAL DATA:  Code stroke.  Aphasia.  Left-sided weakness. EXAM: CT ANGIOGRAPHY HEAD AND NECK TECHNIQUE: Multidetector CT imaging of the head and neck was performed using the standard protocol during bolus administration of intravenous contrast. Multiplanar CT image reconstructions and MIPs were obtained to evaluate the vascular anatomy. Carotid stenosis measurements (when applicable) are obtained utilizing NASCET criteria, using the distal internal carotid diameter as the denominator. CONTRAST:  75mL ISOVUE-370 IOPAMIDOL (ISOVUE-370) INJECTION 76% COMPARISON:  MRI 07/04/2018 FINDINGS: CT HEAD FINDINGS Brain: No evidence of acute infarction, hemorrhage, hydrocephalus, extra-axial collection or mass lesion/mass effect. Vascular: Negative for hyperdense vessel Skull: Negative Sinuses: Negative Orbits: Negative Review of the MIP images confirms the above findings CTA NECK FINDINGS Aortic arch: Normal aortic  arch. Negative for dissection or atherosclerotic disease. Left vertebral artery origin from the arch Right carotid system: Normal right carotid without stenosis or irregularity. Left carotid system: Normal left carotid without stenosis or irregularity Vertebral arteries: Both vertebral arteries widely patent without stenosis or irregularity. Left vertebral artery origin from the aortic arch. Skeleton: Negative Other neck: Negative Upper chest: Negative Review of the MIP images confirms the above findings CTA HEAD FINDINGS Anterior circulation: Cavernous carotid normal bilaterally. Proximal anterior and middle cerebral arteries widely patent bilaterally without stenosis. Occluded branch of distal right MCA in the right parietal region. This corresponds to right M3/M4 occluded segment and corresponds to the area of restricted diffusion on MRI. This is likely a small embolus. No other branch occlusions. Posterior circulation: Both vertebral arteries widely patent to the basilar. PICA patent bilaterally. Basilar widely patent. AICA, superior cerebellar, and posterior cerebral arteries widely patent Venous sinuses: Patent Anatomic variants: None Delayed phase: Not performed Review of the MIP images confirms the above findings IMPRESSION: 1. Right M3/M4 branch occlusion in the right parietal lobe corresponding to acute infarct on MRI. This is presumably an embolus from the heart. No other significant intracranial or extracranial disease. 2. These results were called by telephone at the time of interpretation on 07/04/2018 at 4:09 pm to Dr. Otelia LimesLindzen , who verbally acknowledged these results. Electronically Signed   By: Marlan Palauharles  Clark M.D.   On: 07/04/2018 16:10   Mr Brain Wo Contrast  Result Date: 07/04/2018 CLINICAL DATA:  Left arm weakness, aphasia. EXAM: MRI HEAD WITHOUT CONTRAST TECHNIQUE: Multiplanar, multiecho pulse sequences of the brain and surrounding structures were obtained without intravenous contrast.  COMPARISON:  MRI head 06/26/2003 FINDINGS: Brain: Restricted diffusion in the right parietal lobe compatible with acute infarct. This involves region of the motor strep as well as the high medial parietal lobe. Small area of acute infarct extends into the posterior insula on the right. FLAIR imaging negative for edema. Ventricle size normal. No significant chronic ischemia. Negative for mass lesion. Vascular: Small area of susceptibility in the right parietal lobe probable area of right MCA clot. There appears to be increased signal in the right parietal branch in this area suggesting slow flow. Skull and upper cervical spine: Negative Sinuses/Orbits: Negative Other: Mild IMPRESSION: Acute infarct posterior right MCA territory involving the posterior insula and right parietal lobe in the region of the motor cortex. Negative FLAIR imaging. Probable clot in distal right MCA branch versus focal microhemorrhage. Increased flow in this vessel on FLAIR suggests slow flow. These results were called by telephone at the time of interpretation on 07/04/2018 at 3:00 pm to Dr. Particia NearingHaviland, who  verbally acknowledged these results. Electronically Signed   By: Marlan Palau M.D.   On: 07/04/2018 15:01   Vas [place] Lower Extremity Venous (dvt)  Result Date: 07/05/2018  Lower Venous Study Indications: Stroke.  Performing Technologist: Blanch Media RVS  Examination Guidelines: A complete evaluation includes B-mode imaging, spectral Doppler, color Doppler, and power Doppler as needed of all accessible portions of each vessel. Bilateral testing is considered an integral part of a complete examination. Limited examinations for reoccurring indications may be performed as noted.  Right Venous Findings: +---------+---------------+---------+-----------+----------+-------+          CompressibilityPhasicitySpontaneityPropertiesSummary +---------+---------------+---------+-----------+----------+-------+ CFV      Full           Yes       Yes                          +---------+---------------+---------+-----------+----------+-------+ SFJ      Full                                                 +---------+---------------+---------+-----------+----------+-------+ FV Prox  Full                                                 +---------+---------------+---------+-----------+----------+-------+ FV Mid   Full                                                 +---------+---------------+---------+-----------+----------+-------+ FV DistalFull                                                 +---------+---------------+---------+-----------+----------+-------+ PFV      Full                                                 +---------+---------------+---------+-----------+----------+-------+ POP      Full           Yes      Yes                          +---------+---------------+---------+-----------+----------+-------+ PTV      Full                                                 +---------+---------------+---------+-----------+----------+-------+ PERO     Full                                                 +---------+---------------+---------+-----------+----------+-------+  Left Venous Findings: +---------+---------------+---------+-----------+----------+-------+  CompressibilityPhasicitySpontaneityPropertiesSummary +---------+---------------+---------+-----------+----------+-------+ CFV      Full           Yes      Yes                          +---------+---------------+---------+-----------+----------+-------+ SFJ      Full                                                 +---------+---------------+---------+-----------+----------+-------+ FV Prox  Full                                                 +---------+---------------+---------+-----------+----------+-------+ FV Mid   Full                                                  +---------+---------------+---------+-----------+----------+-------+ FV DistalFull                                                 +---------+---------------+---------+-----------+----------+-------+ PFV      Full                                                 +---------+---------------+---------+-----------+----------+-------+ POP      Full           Yes      Yes                          +---------+---------------+---------+-----------+----------+-------+ PTV      Full                                                 +---------+---------------+---------+-----------+----------+-------+ PERO     Full                                                 +---------+---------------+---------+-----------+----------+-------+    Summary: Right: There is no evidence of deep vein thrombosis in the lower extremity. No cystic structure found in the popliteal fossa. Left: There is no evidence of deep vein thrombosis in the lower extremity. No cystic structure found in the popliteal fossa.  *See table(s) above for measurements and observations.    Preliminary    Scheduled Meds: . aspirin  300 mg Rectal Daily   Or  . aspirin  325 mg Oral Daily  . atorvastatin  20 mg Oral q1800  . clonazePAM  0.5 mg Oral QHS  . enoxaparin (LOVENOX) injection  40 mg Subcutaneous Q24H  . loratadine  10 mg Oral Daily  . [  START ON 07/06/2018] vitamin B-12  1,000 mcg Oral Daily   Continuous Infusions: . sodium chloride Stopped (07/05/18 1204)    LOS: 1 day   Merlene Laughter, DO [facility] PAGER is on AMION  If 7PM-7AM, please contact night-coverage [email or web address] Password [facility] 07/05/2018, 3:25 PM

## 2018-07-05 NOTE — Progress Notes (Signed)
    [facility] has been requested to perform a transesophageal echocardiogram on Krystal Oneal for stroke.  After careful review of history and examination, the risks and benefits of transesophageal echocardiogram have been explained including risks of esophageal damage, perforation (1:10,000 risk), bleeding, pharyngeal hematoma as well as other potential complications associated with conscious sedation including aspiration, arrhythmia, respiratory failure and death. Alternatives to treatment were discussed, questions were answered. Patient is willing to proceed.     The procedure is scheduled for 07/06/18 at 11:30 with Dr. Elease Hashimoto.   Berton Bon, NP  07/05/2018 4:59 PM

## 2018-07-05 NOTE — Progress Notes (Signed)
  Echocardiogram 2D Echocardiogram has been performed.  Gerda Dissrthur L Letonya Mangels 07/05/2018, 2:57 PM

## 2018-07-05 NOTE — Progress Notes (Signed)
Bilateral lower extremity venous duplex has been completed.   Preliminary results in CV Proc.   Blanch Media 07/05/2018 8:27 AM

## 2018-07-05 NOTE — Evaluation (Signed)
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    Clinical Impression   Pt admitted with the above diagnosis and overall seems to have returned to baseline. Pts BP in standing during eval was 109/59 with no symptoms. Pt was educated at length about signs and symptoms of a stroke and verbalized understanding.  No further OT needs at this time.     Follow Up Recommendations  No OT follow up;Supervision - Intermittent    Equipment Recommendations  None recommended by OT    Recommendations for Other Services       Precautions / Restrictions Precautions Precautions: None Restrictions Weight Bearing Restrictions: No      Mobility Bed Mobility Overal bed mobility: Independent             General bed mobility comments: no assist needed  Transfers Overall transfer level: Independent Equipment used: None             General transfer comment: No assist needed.     Balance Overall balance assessment: No apparent balance deficits (not formally assessed)                                         ADL either performed or assessed with clinical judgement   ADL Overall ADL's : Independent;At baseline                                       General ADL Comments: PT independent with all adls. Pt feels tired.     Vision Baseline Vision/History: Wears glasses Wears Glasses: Reading only Patient Visual Report: No change from baseline Vision Assessment?: No apparent visual deficits     Perception     Praxis Praxis Praxis tested?: Within functional limits    Pertinent Vitals/Pain Pain Assessment: No/denies pain     Hand  Dominance Left   Extremity/Trunk Assessment Upper Extremity Assessment Upper Extremity Assessment: Overall WFL for tasks assessed   Lower Extremity Assessment Lower Extremity Assessment: Overall WFL for tasks assessed   Cervical / Trunk Assessment Cervical / Trunk Assessment: Normal   Communication Communication Communication: No difficulties   Cognition Arousal/Alertness: Awake/alert Behavior During Therapy: WFL for tasks assessed/performed Overall Cognitive Status: Within Functional Limits for tasks assessed                                 General Comments: intact   General Comments  Pt feels she has returned to baseline    Exercises     Shoulder Instructions      Home Living Family/patient expects to be discharged to:: Private residence Living Arrangements: Spouse/significant other Available Help at Discharge: Family;Available 24 hours/day Type of Home: House Home Access: Stairs to enter [employer] of Steps: 3 Entrance Stairs-Rails: None Home Layout: One level     Bathroom Shower/Tub: IT trainer: Standard     Home Equipment: None      Lives With: (P) Spouse;Son  Prior Functioning/Environment Level of Independence: Independent        Comments: Pt teaches kindergarten        OT Problem List:        OT Treatment/Interventions:      OT Goals(Current goals can be found in the care plan section) Acute Rehab OT Goals Patient Stated Goal: to go home OT Goal Formulation: All assessment and education complete, DC therapy  OT Frequency:     Barriers to D/C:            Co-evaluation              AM-PAC OT "6 Clicks" Daily Activity     Outcome Measure Help from another person eating meals?: None Help from another person taking care of personal grooming?: None Help from another person toileting, which includes using toliet, bedpan, or urinal?: None Help from another person bathing  (including washing, rinsing, drying)?: None Help from another person to put on and taking off regular upper body clothing?: None Help from another person to put on and taking off regular lower body clothing?: None 6 Click Score: 24   End of Session Nurse Communication: Mobility status  Activity Tolerance: Patient tolerated treatment well Patient left: in chair;with call bell/phone within reach;with family/visitor present  OT Visit Diagnosis: Unsteadiness on feet (R26.81)                Time: 1610-9604: 0859-0926 OT Time Calculation (min): 27 min Charges:  OT General Charges $OT Visit: 1 Visit OT Evaluation $OT Eval Low Complexity: 1 Low OT Treatments $Self Care/Home Management : 8-22 mins  Tory EmeraldHolly Ayako Tapanes, OTR/L [phone]  Hope BuddsJones, [place] Mielke Anne 07/05/2018, 9:33 AM

## 2018-07-05 NOTE — Evaluation (Signed)
Speech Language Pathology Evaluation Patient Details Name: Krystal Oneal MRN: [id number] DOB: 09/05/1970 Today's Date: 07/05/2018 Time: 3646-8032 SLP Time Calculation (min) (ACUTE ONLY): 20 min  Problem List:  Patient Active Problem List   Diagnosis Date Noted  . CVA (cerebral vascular accident) (HCC) 07/04/2018  . Anxiety 07/04/2018   Past Medical History:  Past Medical History:  Diagnosis Date  . Anxiety   . CVA (cerebral vascular accident) (HCC) 07/04/2018   Past Surgical History:  Past Surgical History:  Procedure Laterality Date  . HYDRADENITIS EXCISION     HPI:  Pt a 48 y.o. female with medical history significant of anxiety. She presented to he ED with dizziness, near syncope, slurred speech, and dyscoordination. An MRI was conducted on 07/04/18 and revealed acute infarct posterior right MCA territory involving the posterior insula and right parietal lobe in the region of the motor cortex.   Assessment / Plan / Recommendation Clinical Impression  Pt was seen for speech/language/cognition evaluation and she reported that she is no longer having any deficits. The [facility] Cognitive Assessment 8.2 was administered to evaluate her current cognitive-linguistic skills. She achieved a score of 29/30 which is whithin the normal limits of 26 or more out of 30. No speech or language deficits were appreciated. Overall, the pt's speech, language, and cognition appear to be within normal limits. Further skilled SLP services are not clinically indicated at this time.     SLP Assessment  SLP Recommendation/Assessment: Patient does not need any further Speech Lanaguage Pathology Services SLP Visit Diagnosis: Cognitive communication deficit (R41.841)    Follow Up Recommendations  None    Frequency and Duration           SLP Evaluation Cognition  Overall Cognitive Status: Within Functional Limits for tasks assessed Arousal/Alertness: Awake/alert Orientation Level: Oriented  X4 Attention: Focused;Sustained Focused Attention: Appears intact Sustained Attention: Appears intact Memory: Appears intact Awareness: Appears intact Problem Solving: Appears intact Executive Function: Reasoning;Sequencing;Organizing;Decision Making Reasoning: Appears intact Sequencing: Appears intact Organizing: Appears intact Decision Making: Appears intact Safety/Judgment: Appears intact [place] Scales of Cognitive Functioning: Purposeful/appropriate       Comprehension  Auditory Comprehension Overall Auditory Comprehension: Appears within functional limits for tasks assessed Yes/No Questions: Within Functional Limits Commands: Within Functional Limits Conversation: Complex    Expression Expression Primary Mode of Expression: Verbal Verbal Expression Overall Verbal Expression: Appears within functional limits for tasks assessed Initiation: No impairment Level of Generative/Spontaneous Verbalization: Conversation Repetition: No impairment Naming: No impairment Pragmatics: No impairment Written Expression Dominant Hand: Left   Oral / Motor  Motor Speech Overall Motor Speech: Appears within functional limits for tasks assessed Respiration: Within functional limits Phonation: Normal Resonance: Within functional limits Articulation: Within functional limitis Intelligibility: Intelligible Motor Planning: Witnin functional limits Motor Speech Errors: Not applicable                      Brittinie Wherley I. Vear Clock, MS, CCC-SLP Acute Rehabilitation Services Office number [phone] Pager [phone]  Scheryl Marten 07/05/2018, 10:31 AM

## 2018-07-06 ENCOUNTER — Inpatient Hospital Stay (HOSPITAL_COMMUNITY): Payer: BC Managed Care – PPO | Admitting: Anesthesiology

## 2018-07-06 ENCOUNTER — Encounter (HOSPITAL_COMMUNITY)
Admission: EM | Disposition: A | Discharge status: Home / Self Care | Payer: Self-pay | Source: Home / Self Care | Attending: Internal Medicine

## 2018-07-06 ENCOUNTER — Encounter (HOSPITAL_COMMUNITY): Payer: Self-pay | Admitting: Certified Registered Nurse Anesthetist

## 2018-07-06 ENCOUNTER — Telehealth: Payer: Self-pay

## 2018-07-06 ENCOUNTER — Inpatient Hospital Stay (HOSPITAL_COMMUNITY): Payer: BC Managed Care – PPO

## 2018-07-06 DIAGNOSIS — I6389 Other cerebral infarction: Secondary | ICD-10-CM

## 2018-07-06 DIAGNOSIS — E538 Deficiency of other specified B group vitamins: Secondary | ICD-10-CM

## 2018-07-06 DIAGNOSIS — Q211 Atrial septal defect: Secondary | ICD-10-CM

## 2018-07-06 DIAGNOSIS — D649 Anemia, unspecified: Secondary | ICD-10-CM

## 2018-07-06 HISTORY — PX: TEE WITHOUT CARDIOVERSION: SHX5443

## 2018-07-06 LAB — CBC WITH DIFFERENTIAL/PLATELET
Abs Immature Granulocytes: 0.03 10*3/uL (ref 0.00–0.07)
Basophils Absolute: 0 10*3/uL (ref 0.0–0.1)
Basophils Relative: 1 %
Eosinophils Absolute: 0.1 10*3/uL (ref 0.0–0.5)
Eosinophils Relative: 2 %
HCT: 35.5 % — ABNORMAL LOW (ref 36.0–46.0)
Hemoglobin: 11.1 g/dL — ABNORMAL LOW (ref 12.0–15.0)
Immature Granulocytes: 0 %
Lymphocytes Relative: 33 %
Lymphs Abs: 2.3 10*3/uL (ref 0.7–4.0)
MCH: 29.4 pg (ref 26.0–34.0)
MCHC: 31.3 g/dL (ref 30.0–36.0)
MCV: 93.9 fL (ref 80.0–100.0)
Monocytes Absolute: 0.6 10*3/uL (ref 0.1–1.0)
Monocytes Relative: 8 %
Neutro Abs: 3.9 10*3/uL (ref 1.7–7.7)
Neutrophils Relative %: 56 %
Platelets: 212 10*3/uL (ref 150–400)
RBC: 3.78 MIL/uL — ABNORMAL LOW (ref 3.87–5.11)
RDW: 11.9 % (ref 11.5–15.5)
WBC: 6.9 10*3/uL (ref 4.0–10.5)
nRBC: 0 % (ref 0.0–0.2)

## 2018-07-06 LAB — RPR: RPR Ser Ql: NONREACTIVE

## 2018-07-06 LAB — COMPREHENSIVE METABOLIC PANEL
ALK PHOS: 53 U/L (ref 38–126)
ALT: 14 U/L (ref 0–44)
AST: 16 U/L (ref 15–41)
Albumin: 3.2 g/dL — ABNORMAL LOW (ref 3.5–5.0)
Anion gap: 6 (ref 5–15)
BUN: 5 mg/dL — ABNORMAL LOW (ref 6–20)
CALCIUM: 8.3 mg/dL — AB (ref 8.9–10.3)
CO2: 22 mmol/L (ref 22–32)
Chloride: 113 mmol/L — ABNORMAL HIGH (ref 98–111)
Creatinine, Ser: 0.68 mg/dL (ref 0.44–1.00)
GFR calc Af Amer: >60 mL/min (ref >60)
GFR calc non Af Amer: >60 mL/min (ref >60)
Glucose, Bld: 101 mg/dL — ABNORMAL HIGH (ref 70–99)
Potassium: 4.2 mmol/L (ref 3.5–5.1)
Sodium: 141 mmol/L (ref 135–145)
Total Bilirubin: 0.5 mg/dL (ref 0.3–1.2)
Total Protein: 5.4 g/dL — ABNORMAL LOW (ref 6.5–8.1)

## 2018-07-06 LAB — PHOSPHORUS: Phosphorus: 2.7 mg/dL (ref 2.5–4.6)

## 2018-07-06 LAB — HOMOCYSTEINE: Homocysteine: 5.7 umol/L (ref 0.0–15.0)

## 2018-07-06 LAB — BETA-2-GLYCOPROTEIN I ABS, IGG/M/A
Beta-2 Glyco I IgG: <9 GPI IgG units (ref 0–20)
Beta-2-Glycoprotein I IgA: <9 GPI IgA units (ref 0–25)
Beta-2-Glycoprotein I IgM: <9 GPI IgM units (ref 0–32)

## 2018-07-06 LAB — MAGNESIUM: Magnesium: 1.9 mg/dL (ref 1.7–2.4)

## 2018-07-06 LAB — ANTINUCLEAR ANTIBODIES, IFA: ANA Ab, IFA: NEGATIVE

## 2018-07-06 LAB — CARDIOLIPIN ANTIBODIES, IGG, IGM, IGA
Anticardiolipin IgA: <9 APL U/mL (ref 0–11)
Anticardiolipin IgG: <9 GPL U/mL (ref 0–14)
Anticardiolipin IgM: <9 MPL U/mL (ref 0–12)

## 2018-07-06 LAB — ANTI-DNA ANTIBODY, DOUBLE-STRANDED: ds DNA Ab: <1 IU/mL (ref 0–9)

## 2018-07-06 LAB — LUPUS ANTICOAGULANT PANEL
DRVVT: 38.6 s (ref 0.0–47.0)
PTT Lupus Anticoagulant: 36.7 s (ref 0.0–51.9)

## 2018-07-06 SURGERY — LOOP RECORDER INSERTION

## 2018-07-06 SURGERY — ECHOCARDIOGRAM, TRANSESOPHAGEAL
Anesthesia: General

## 2018-07-06 MED ORDER — PROPOFOL 10 MG/ML IV BOLUS
INTRAVENOUS | Status: DC | PRN
  Administered 2018-07-06: 20 mg via INTRAVENOUS
  Administered 2018-07-06: 30 mg via INTRAVENOUS
  Administered 2018-07-06: 20 mg via INTRAVENOUS

## 2018-07-06 MED ORDER — SODIUM CHLORIDE (PF) 0.9 % IJ SOLN
INTRAMUSCULAR | Status: DC | PRN
  Administered 2018-07-06: 9 mL via INTRAVENOUS

## 2018-07-06 MED ORDER — APIXABAN 5 MG PO TABS
5.0000 mg | ORAL_TABLET | Freq: Two times a day (BID) | ORAL | Status: DC
  Administered 2018-07-06: 5 mg via ORAL
  Filled 2018-07-06: qty 1

## 2018-07-06 MED ORDER — APIXABAN 5 MG PO TABS: 5.0000 mg | ORAL_TABLET | Freq: Two times a day (BID) | ORAL | 0 refills | Status: DC

## 2018-07-06 MED ORDER — PROPOFOL 500 MG/50ML IV EMUL
INTRAVENOUS | Status: DC | PRN
  Administered 2018-07-06: 75 ug/kg/min via INTRAVENOUS

## 2018-07-06 MED ORDER — ATORVASTATIN CALCIUM 20 MG PO TABS: 20.0000 mg | ORAL_TABLET | Freq: Every day | ORAL | 0 refills | Status: AC

## 2018-07-06 MED ORDER — CYANOCOBALAMIN 1000 MCG PO TABS: 1000.0000 ug | ORAL_TABLET | Freq: Every day | ORAL | 0 refills | Status: DC

## 2018-07-06 MED ORDER — SODIUM CHLORIDE 0.9 % IV SOLN: INTRAVENOUS | Status: DC

## 2018-07-06 MED FILL — ELIQUIS 5 MG TABLET: 5 | 30 days supply | Qty: 60 | Fill #0

## 2018-07-06 NOTE — Telephone Encounter (Signed)
Confirmed consult with Dr. Excell Seltzer 1/27. Tentatively scheduled PFO closure 2/5.  Krystal Oneal was grateful for call and agrees with treatment plan.

## 2018-07-06 NOTE — Consult Note (Addendum)
ELECTROPHYSIOLOGY CONSULT NOTE  Patient ID: Krystal Oneal MRN: [id number], DOB/AGE: 47/16/1972   Admit date: 07/04/2018 Date of Consult: 07/06/2018  Primary Physician: Everlean Cherry, MD Primary Cardiologist: none Reason for Consultation: Cryptogenic stroke ; recommendations regarding Implantable Loop Recorder, requested by Dr. Roda Shutters  History of Present Illness Krystal Oneal was admitted on 07/04/2018 with stroke.  They first developed symptoms while at work.   PMHx include anxiety only Imaging demonstrated acute infarct in theposterior right MCA .  she has undergone workup for stroke including echocardiogram and carotid angio.  The patient has been monitored on telemetry which has demonstrated sinus rhythm with no arrhythmias.  Inpatient stroke work-up is to be completed with a TEE.   Echocardiogram this admission demonstrated  Study Conclusions - Left ventricle: The cavity size was normal. Wall thickness was   normal. Systolic function was normal. The estimated ejection   fraction was in the range of 55% to 60%. Wall motion was normal;   there were no regional wall motion abnormalities. Left   ventricular diastolic function parameters were normal. Impressions: - Normal LV systolic and diastolic function.    Lab work is reviewed.  Prior to admission, the patient denies chest pain, shortness of breath, dizziness, or syncope.  She has very infrequently felt a "gurgling" perhaps fluttering in her chest at night.  She is recovering from the stroke with plans to home at discharge.   Past Medical History:  Diagnosis Date  . Anxiety   . CVA (cerebral vascular accident) (HCC) 07/04/2018     Surgical History:  Past Surgical History:  Procedure Laterality Date  . HYDRADENITIS EXCISION       Medications Prior to Admission  Medication Sig Dispense Refill Last Dose  . clonazePAM (KLONOPIN) 0.5 MG tablet Take 0.5 mg by mouth daily.   07/03/2018 at Unknown time  . loratadine (CLARITIN)  10 MG tablet Take 10 mg by mouth daily.   07/04/2018 at Unknown time  . Multiple Vitamin (MULTIVITAMIN WITH MINERALS) TABS tablet Take 1 tablet by mouth daily.   07/04/2018 at Unknown time  . Probiotic Product (PROBIOTIC DAILY PO) Take 1 tablet by mouth daily.   07/04/2018 at Unknown time    Inpatient Medications:  . [MAR Hold] aspirin  300 mg Rectal Daily   Or  . [MAR Hold] aspirin  325 mg Oral Daily  . [MAR Hold] atorvastatin  20 mg Oral q1800  . [MAR Hold] clonazePAM  0.5 mg Oral QHS  . [MAR Hold] enoxaparin (LOVENOX) injection  40 mg Subcutaneous Q24H  . [MAR Hold] loratadine  10 mg Oral Daily  . [MAR Hold] vitamin B-12  1,000 mcg Oral Daily    Allergies:  Allergies  Allergen Reactions  . Sulfa Antibiotics Itching  . Amoxicillin Hives and Rash  . Penicillins Hives and Rash    Did it involve swelling of the face/tongue/throat, SOB, or low BP? Yes Did it involve sudden or severe rash/hives, skin peeling, or any reaction on the inside of your mouth or nose? Yes Did you need to seek medical attention at a hospital or doctor's office? No When did it last happen?2 years ago If all above answers are "NO", may proceed with cephalosporin use.     Social History   Socioeconomic History  . Marital status: Married    Spouse name: Not on file  . Number of children: Not on file  . Years of education: Not on file  . Highest education level: Not on file  Occupational History  . Occupation: Geologist, engineeringteacher assistant  Social Needs  . Financial resource strain: Not on file  . Food insecurity:    Worry: Not on file    Inability: Not on file  . Transportation needs:    Medical: Not on file    Non-medical: Not on file  Tobacco Use  . Smoking status: Former Smoker    Packs/day: 1.00    Years: 30.00    Pack years: 30.00    Types: Cigarettes    Last attempt to quit: 2017    Years since quitting: 3.0  . Smokeless tobacco: Never Used  Substance and Sexual Activity  . Alcohol use: Never      Frequency: Never  . Drug use: Never  . Sexual activity: Not on file  Lifestyle  . Physical activity:    Days per week: Not on file    Minutes per session: Not on file  . Stress: Not on file  Relationships  . Social connections:    Talks on phone: Not on file    Gets together: Not on file    Attends religious service: Not on file    Active member of club or organization: Not on file    Attends meetings of clubs or organizations: Not on file    Relationship status: Not on file  . Intimate partner violence:    Fear of current or ex partner: Not on file    Emotionally abused: Not on file    Physically abused: Not on file    Forced sexual activity: Not on file  Other Topics Concern  . Not on file  Social History Narrative  . Not on file     Family History  Problem Relation Age of Onset  . Hypertension Mother   . CVA Mother 1588  . Hypertension Father       Review of Systems: All other systems reviewed and are otherwise negative except as noted above.  Physical Exam: Vitals:   07/05/18 2007 07/05/18 2335 07/06/18 0351 07/06/18 0859  BP: (!) 105/58 99/61 102/61 (!) 105/55  Pulse: 71 (!) 56 (!) 58 (!) 56  Resp: 17 16 16 16   Temp: 98.3 F (36.8 C) 98.2 F (36.8 C) (!) 97.3 F (36.3 C) 98.1 F (36.7 C)  TempSrc: Oral Oral Oral Oral  SpO2: 99% 97% 99% 98%  Weight:      Height:        GEN- The patient is well appearing, alert and oriented x 3 today.   Head- normocephalic, atraumatic Eyes-  Sclera clear, conjunctiva pink Ears- hearing intact Oropharynx- clear Neck- supple Lungs- CTA b/l, normal work of breathing Heart- RRR, no murmurs, rubs or gallops  GI- soft, NT, ND Extremities- no clubbing, cyanosis, or edema MS- no significant deformity or atrophy Skin- no rash or lesion Psych- euthymic mood, full affect   Labs:   Lab Results  Component Value Date   WBC 6.9 07/06/2018   HGB 11.1 (L) 07/06/2018   HCT 35.5 (L) 07/06/2018   MCV 93.9 07/06/2018    PLT 212 07/06/2018    Recent Labs  Lab 07/06/18 0653  NA 141  K 4.2  CL 113*  CO2 22  BUN 5*  CREATININE 0.68  CALCIUM 8.3*  PROT 5.4*  BILITOT 0.5  ALKPHOS 53  ALT 14  AST 16  GLUCOSE 101*   No results found for: CKTOTAL, CKMB, CKMBINDEX, TROPONINI Lab Results  Component Value Date   CHOL 134 07/05/2018   Lab  Results  Component Value Date   HDL 32 (L) 07/05/2018   Lab Results  Component Value Date   LDLCALC 79 07/05/2018   Lab Results  Component Value Date   TRIG 117 07/05/2018   Lab Results  Component Value Date   CHOLHDL 4.2 07/05/2018   No results found for: LDLDIRECT  No results found for: DDIMER   Radiology/Studies:   Ct Angio Head W Or Wo Contrast Result Date: 07/04/2018 CLINICAL DATA:  Code stroke.  Aphasia.  Left-sided weakness. EXAM: CT ANGIOGRAPHY HEAD AND NECK TECHNIQUE: Multidetector CT imaging of the head and neck was performed using the standard protocol during bolus administration of intravenous contrast. Multiplanar CT image reconstructions and MIPs were obtained to evaluate the vascular anatomy. Carotid stenosis measurements (when applicable) are obtained utilizing NASCET criteria, using the distal internal carotid diameter as the denominator. CONTRAST:  75mL ISOVUE-370 IOPAMIDOL (ISOVUE-370) INJECTION 76% COMPARISON:  MRI 07/04/2018 FINDINGS: CT HEAD FINDINGS Brain: No evidence of acute infarction, hemorrhage, hydrocephalus, extra-axial collection or mass lesion/mass effect. Vascular: Negative for hyperdense vessel Skull: Negative Sinuses: Negative Orbits: Negative Review of the MIP images confirms the above findings CTA NECK FINDINGS Aortic arch: Normal aortic arch. Negative for dissection or atherosclerotic disease. Left vertebral artery origin from the arch Right carotid system: Normal right carotid without stenosis or irregularity. Left carotid system: Normal left carotid without stenosis or irregularity Vertebral arteries: Both vertebral arteries  widely patent without stenosis or irregularity. Left vertebral artery origin from the aortic arch. Skeleton: Negative Other neck: Negative Upper chest: Negative Review of the MIP images confirms the above findings CTA HEAD FINDINGS Anterior circulation: Cavernous carotid normal bilaterally. Proximal anterior and middle cerebral arteries widely patent bilaterally without stenosis. Occluded branch of distal right MCA in the right parietal region. This corresponds to right M3/M4 occluded segment and corresponds to the area of restricted diffusion on MRI. This is likely a small embolus. No other branch occlusions. Posterior circulation: Both vertebral arteries widely patent to the basilar. PICA patent bilaterally. Basilar widely patent. AICA, superior cerebellar, and posterior cerebral arteries widely patent Venous sinuses: Patent Anatomic variants: None Delayed phase: Not performed Review of the MIP images confirms the above findings IMPRESSION: 1. Right M3/M4 branch occlusion in the right parietal lobe corresponding to acute infarct on MRI. This is presumably an embolus from the heart. No other significant intracranial or extracranial disease. 2. These results were called by telephone at the time of interpretation on 07/04/2018 at 4:09 pm to Dr. Otelia Limes , who verbally acknowledged these results. Electronically Signed   By: Marlan Palau M.D.   On: 07/04/2018 16:10    Mr Brain Wo Contrast Result Date: 07/04/2018 CLINICAL DATA:  Left arm weakness, aphasia. EXAM: MRI HEAD WITHOUT CONTRAST TECHNIQUE: Multiplanar, multiecho pulse sequences of the brain and surrounding structures were obtained without intravenous contrast. COMPARISON:  MRI head 06/26/2003 FINDINGS: Brain: Restricted diffusion in the right parietal lobe compatible with acute infarct. This involves region of the motor strep as well as the high medial parietal lobe. Small area of acute infarct extends into the posterior insula on the right. FLAIR imaging  negative for edema. Ventricle size normal. No significant chronic ischemia. Negative for mass lesion. Vascular: Small area of susceptibility in the right parietal lobe probable area of right MCA clot. There appears to be increased signal in the right parietal branch in this area suggesting slow flow. Skull and upper cervical spine: Negative Sinuses/Orbits: Negative Other: Mild IMPRESSION: Acute infarct posterior right MCA territory  involving the posterior insula and right parietal lobe in the region of the motor cortex. Negative FLAIR imaging. Probable clot in distal right MCA branch versus focal microhemorrhage. Increased flow in this vessel on FLAIR suggests slow flow. These results were called by telephone at the time of interpretation on 07/04/2018 at 3:00 pm to Dr. Particia NearingHaviland, who verbally acknowledged these results. Electronically Signed   By: Marlan Palauharles  Clark M.D.   On: 07/04/2018 15:01    Vas [place] Lower Extremity Venous (dvt) Result Date: 07/05/2018  Lower Venous Study Indications: Stroke.  Performing Technologist: Blanch MediaMegan Riddle RVS  Examination Guidelines: A complete evaluation includes B-mode imaging, spectral Doppler, color Doppler, and power Doppler as needed of all accessible portions of each vessel. Bilateral testing is considered an integral part of a complete examination. Limited examinations for reoccurring indications may be performed as noted.  Right Venous Summary: Right: There is no evidence of deep vein thrombosis in the lower extremity. No cystic structure found in the popliteal fossa. Left: There is no evidence of deep vein thrombosis in the lower extremity. No cystic structure found in the popliteal fossa.  *See table(s) above for measurements and observations. Electronically signed by Lemar LivingsBrandon Cain MD on 07/05/2018 at 3:42:01 PM.    Final     12-lead ECG SR All prior EKG's in EPIC reviewed with no documented atrial fibrillation  Telemetry SR  Assessment and Plan:  1. Cryptogenic  stroke The patient presents with cryptogenic stroke.  The patient has a TEE planned for this AM.  I spoke at length with the patient about monitoring for afib with either a 30 day event monitor or an implantable loop recorder.  Risks, benefits, and alteratives to implantable loop recorder were discussed with the patient today.   At this time, the patient is very clear in their decision to proceed with implantable loop recorder.   Wound care was reviewed with the patient (keep incision clean and dry for 3 days).  Wound check Dewarren Ledbetter be scheduled for the patient  Please call with questions.   Sheilah PigeonRenee Lynn Ursuy, PA-C 07/06/2018  I have seen and examined this patient with Francis Dowseenee Ursuy.  Agree with above, note added to reflect my findings.  On exam, RRR, no murmurs, lungs clear.  Admitted to hospital with cryptogenic stroke.  TEE showed a large PFO and she has plans for anticoagulation and possible closure of PFO.  We Travaughn Vue hold off on link monitor at this time.  Henri Baumler M. Giankarlo Leamer MD 07/06/2018 11:50 AM

## 2018-07-06 NOTE — Progress Notes (Signed)
  Echocardiogram Echocardiogram Transesophageal with 3D has been performed.  Krystal Oneal, Krystal Oneal M 07/06/2018, 10:30 AM

## 2018-07-06 NOTE — Anesthesia Procedure Notes (Signed)
Procedure Name: MAC Date/Time: 07/06/2018 9:44 AM Performed by: Candis Shine, CRNA Pre-anesthesia Checklist: Patient identified, Emergency Drugs available, Suction available, Patient being monitored and Timeout performed Patient Re-evaluated:Patient Re-evaluated prior to induction Oxygen Delivery Method: Nasal cannula Dental Injury: Teeth and Oropharynx as per pre-operative assessment

## 2018-07-06 NOTE — Discharge Instructions (Signed)

## 2018-07-06 NOTE — Anesthesia Postprocedure Evaluation (Signed)
Anesthesia Post Note  Patient: Krystal Oneal  Procedure(s) Performed: TRANSESOPHAGEAL ECHOCARDIOGRAM (TEE) (N/A ) BUBBLE STUDY     Patient location during evaluation: PACU Anesthesia Type: MAC Level of consciousness: awake and alert Pain management: pain level controlled Vital Signs Assessment: post-procedure vital signs reviewed and stable Respiratory status: spontaneous breathing, nonlabored ventilation, respiratory function stable and patient connected to nasal cannula oxygen Cardiovascular status: stable and blood pressure returned to baseline Postop Assessment: no apparent nausea or vomiting Anesthetic complications: no    Last Vitals:  Vitals:   07/06/18 1020 07/06/18 1043  BP: 109/60 (!) 105/59  Pulse: 77 (!) 57  Resp: 20 18  Temp:  36.5 C  SpO2: 98% 99%    Last Pain:  Vitals:   07/06/18 1200  TempSrc:   PainSc: 0-No pain                 Solveig Fangman L Darryl Blumenstein

## 2018-07-06 NOTE — Interval H&P Note (Signed)
History and Physical Interval Note:  07/06/2018 9:17 AM  Krystal Oneal  has presented today for surgery, with the diagnosis of stroke  The various methods of treatment have been discussed with the patient and family. After consideration of risks, benefits and other options for treatment, the patient has consented to  Procedure(s): TRANSESOPHAGEAL ECHOCARDIOGRAM (TEE) (N/A) as a surgical intervention .  The patient's history has been reviewed, patient examined, no change in status, stable for surgery.  I have reviewed the patient's chart and labs.  Questions were answered to the patient's satisfaction.     [name]

## 2018-07-06 NOTE — Transfer of Care (Signed)
Immediate Anesthesia Transfer of Care Note  Patient: Krystal Oneal  Procedure(s) Performed: TRANSESOPHAGEAL ECHOCARDIOGRAM (TEE) (N/A ) BUBBLE STUDY  Patient Location: Endoscopy Unit  Anesthesia Type:MAC  Level of Consciousness: awake, alert  and oriented  Airway & Oxygen Therapy: Patient Spontanous Breathing  Post-op Assessment: Report given to RN and Post -op Vital signs reviewed and stable  Post vital signs: Reviewed and stable  Last Vitals:  Vitals Value Taken Time  BP    Temp    Pulse    Resp    SpO2      Last Pain:  Vitals:   07/06/18 0859  TempSrc: Oral  PainSc: 0-No pain         Complications: No apparent anesthesia complications

## 2018-07-06 NOTE — Anesthesia Preprocedure Evaluation (Addendum)
Anesthesia Evaluation  Patient identified by MRN, date of birth, ID band Patient awake    Reviewed: Allergy & Precautions, NPO status , Patient's Chart, lab work & pertinent test results  Airway Mallampati: I  TM Distance: >3 FB Neck ROM: Full    Dental no notable dental hx. (+) Loose, Dental Advisory Given,    Pulmonary neg pulmonary ROS, former smoker,    Pulmonary exam normal breath sounds clear to auscultation       Cardiovascular negative cardio ROS Normal cardiovascular exam Rhythm:Regular Rate:Normal  TTE 07/05/18 - Left ventricle: The cavity size was normal. Wall thickness was normal. Systolic function was normal. The estimated ejection fraction was in the range of 55% to 60%. Wall motion was normal; there were no regional wall motion abnormalities. Left ventricular diastolic function parameters were normal. Impressions: - Normal LV systolic and diastolic function   Neuro/Psych PSYCHIATRIC DISORDERS Anxiety CVA    GI/Hepatic negative GI ROS, Neg liver ROS,   Endo/Other  negative endocrine ROS  Renal/GU negative Renal ROS  negative genitourinary   Musculoskeletal negative musculoskeletal ROS (+)   Abdominal   Peds  Hematology negative hematology ROS (+)   Anesthesia Other Findings TEE for acute stroke 07/04/18  Reproductive/Obstetrics                            Anesthesia Physical Anesthesia Plan  ASA: II  Anesthesia Plan: MAC   Post-op Pain Management:    Induction: Intravenous  PONV Risk Score and Plan: 2 and Propofol infusion and Treatment may vary due to age or medical condition  Airway Management Planned: Natural Airway  Additional Equipment:   Intra-op Plan:   Post-operative Plan:   Informed Consent: I have reviewed the patients History and Physical, chart, labs and discussed the procedure including the risks, benefits and alternatives for the proposed anesthesia  with the patient or authorized representative who has indicated his/her understanding and acceptance.     Dental advisory given  Plan Discussed with: CRNA  Anesthesia Plan Comments:        Anesthesia Quick Evaluation

## 2018-07-06 NOTE — Progress Notes (Signed)
STROKE TEAM PROGRESS NOTE   INTERVAL HISTORY Her husband is at the bedside.  Patient awake, alert, sitting up in bed having lunch, just had TEE showed large PFO with ASA.  Discussed with Dr. Melburn PopperNasher, will cancel loop recorder and put her on Eliquis anticoagulation, refer to Dr. Excell Seltzerooper for PFO closure ASAP.  Vitals:   07/06/18 1006 07/06/18 1010 07/06/18 1020 07/06/18 1043  BP: (!) 101/58 (!) 101/58 109/60 (!) 105/59  Pulse: 76 73 77 (!) 57  Resp: 12 16 20 18   Temp: 97.9 F (36.6 C)   97.7 F (36.5 C)  TempSrc: Oral   Oral  SpO2: 96% 96% 98% 99%  Weight:      Height:        CBC:  Recent Labs  Lab 07/05/18 0946 07/06/18 0653  WBC 8.0 6.9  NEUTROABS 4.8 3.9  HGB 11.7* 11.1*  HCT 37.2 35.5*  MCV 94.2 93.9  PLT 241 212    Basic Metabolic Panel:  Recent Labs  Lab 07/05/18 0946 07/06/18 0653  NA 142 141  K 4.5 4.2  CL 112* 113*  CO2 23 22  GLUCOSE 104* 101*  BUN <5* 5*  CREATININE 0.66 0.68  CALCIUM 8.3* 8.3*  MG 1.9 1.9  PHOS 1.9* 2.7   Lipid Panel:     Component Value Date/Time   CHOL 134 07/05/2018 0616   TRIG 117 07/05/2018 0616   HDL 32 (L) 07/05/2018 0616   CHOLHDL 4.2 07/05/2018 0616   VLDL 23 07/05/2018 0616   LDLCALC 79 07/05/2018 0616   HgbA1c:  Lab Results  Component Value Date   HGBA1C 5.3 07/05/2018   Urine Drug Screen: No results found for: LABOPIA, COCAINSCRNUR, LABBENZ, AMPHETMU, THCU, LABBARB  Alcohol Level     Component Value Date/Time   ETH <10 07/04/2018 1216    IMAGING Ct Angio Head W Or Wo Contrast  Result Date: 07/04/2018 CLINICAL DATA:  Code stroke.  Aphasia.  Left-sided weakness. EXAM: CT ANGIOGRAPHY HEAD AND NECK TECHNIQUE: Multidetector CT imaging of the head and neck was performed using the standard protocol during bolus administration of intravenous contrast. Multiplanar CT image reconstructions and MIPs were obtained to evaluate the vascular anatomy. Carotid stenosis measurements (when applicable) are obtained utilizing  NASCET criteria, using the distal internal carotid diameter as the denominator. CONTRAST:  75mL ISOVUE-370 IOPAMIDOL (ISOVUE-370) INJECTION 76% COMPARISON:  MRI 07/04/2018 FINDINGS: CT HEAD FINDINGS Brain: No evidence of acute infarction, hemorrhage, hydrocephalus, extra-axial collection or mass lesion/mass effect. Vascular: Negative for hyperdense vessel Skull: Negative Sinuses: Negative Orbits: Negative Review of the MIP images confirms the above findings CTA NECK FINDINGS Aortic arch: Normal aortic arch. Negative for dissection or atherosclerotic disease. Left vertebral artery origin from the arch Right carotid system: Normal right carotid without stenosis or irregularity. Left carotid system: Normal left carotid without stenosis or irregularity Vertebral arteries: Both vertebral arteries widely patent without stenosis or irregularity. Left vertebral artery origin from the aortic arch. Skeleton: Negative Other neck: Negative Upper chest: Negative Review of the MIP images confirms the above findings CTA HEAD FINDINGS Anterior circulation: Cavernous carotid normal bilaterally. Proximal anterior and middle cerebral arteries widely patent bilaterally without stenosis. Occluded branch of distal right MCA in the right parietal region. This corresponds to right M3/M4 occluded segment and corresponds to the area of restricted diffusion on MRI. This is likely a small embolus. No other branch occlusions. Posterior circulation: Both vertebral arteries widely patent to the basilar. PICA patent bilaterally. Basilar widely patent. AICA, superior cerebellar,  and posterior cerebral arteries widely patent Venous sinuses: Patent Anatomic variants: None Delayed phase: Not performed Review of the MIP images confirms the above findings IMPRESSION: 1. Right M3/M4 branch occlusion in the right parietal lobe corresponding to acute infarct on MRI. This is presumably an embolus from the heart. No other significant intracranial or  extracranial disease. 2. These results were called by telephone at the time of interpretation on 07/04/2018 at 4:09 pm to Dr. Otelia LimesLindzen , who verbally acknowledged these results. Electronically Signed   By: Marlan Palauharles  Clark M.D.   On: 07/04/2018 16:10   Ct Angio Neck W And/or Wo Contrast  Result Date: 07/04/2018 CLINICAL DATA:  Code stroke.  Aphasia.  Left-sided weakness. EXAM: CT ANGIOGRAPHY HEAD AND NECK TECHNIQUE: Multidetector CT imaging of the head and neck was performed using the standard protocol during bolus administration of intravenous contrast. Multiplanar CT image reconstructions and MIPs were obtained to evaluate the vascular anatomy. Carotid stenosis measurements (when applicable) are obtained utilizing NASCET criteria, using the distal internal carotid diameter as the denominator. CONTRAST:  75mL ISOVUE-370 IOPAMIDOL (ISOVUE-370) INJECTION 76% COMPARISON:  MRI 07/04/2018 FINDINGS: CT HEAD FINDINGS Brain: No evidence of acute infarction, hemorrhage, hydrocephalus, extra-axial collection or mass lesion/mass effect. Vascular: Negative for hyperdense vessel Skull: Negative Sinuses: Negative Orbits: Negative Review of the MIP images confirms the above findings CTA NECK FINDINGS Aortic arch: Normal aortic arch. Negative for dissection or atherosclerotic disease. Left vertebral artery origin from the arch Right carotid system: Normal right carotid without stenosis or irregularity. Left carotid system: Normal left carotid without stenosis or irregularity Vertebral arteries: Both vertebral arteries widely patent without stenosis or irregularity. Left vertebral artery origin from the aortic arch. Skeleton: Negative Other neck: Negative Upper chest: Negative Review of the MIP images confirms the above findings CTA HEAD FINDINGS Anterior circulation: Cavernous carotid normal bilaterally. Proximal anterior and middle cerebral arteries widely patent bilaterally without stenosis. Occluded branch of distal right MCA  in the right parietal region. This corresponds to right M3/M4 occluded segment and corresponds to the area of restricted diffusion on MRI. This is likely a small embolus. No other branch occlusions. Posterior circulation: Both vertebral arteries widely patent to the basilar. PICA patent bilaterally. Basilar widely patent. AICA, superior cerebellar, and posterior cerebral arteries widely patent Venous sinuses: Patent Anatomic variants: None Delayed phase: Not performed Review of the MIP images confirms the above findings IMPRESSION: 1. Right M3/M4 branch occlusion in the right parietal lobe corresponding to acute infarct on MRI. This is presumably an embolus from the heart. No other significant intracranial or extracranial disease. 2. These results were called by telephone at the time of interpretation on 07/04/2018 at 4:09 pm to Dr. Otelia LimesLindzen , who verbally acknowledged these results. Electronically Signed   By: Marlan Palauharles  Clark M.D.   On: 07/04/2018 16:10   Vas [place] Lower Extremity Venous (dvt)  Result Date: 07/05/2018  Lower Venous Study Indications: Stroke.  Performing Technologist: Blanch MediaMegan Riddle RVS  Examination Guidelines: A complete evaluation includes B-mode imaging, spectral Doppler, color Doppler, and power Doppler as needed of all accessible portions of each vessel. Bilateral testing is considered an integral part of a complete examination. Limited examinations for reoccurring indications may be performed as noted.  Right Venous Findings: +---------+---------------+---------+-----------+----------+-------+          CompressibilityPhasicitySpontaneityPropertiesSummary +---------+---------------+---------+-----------+----------+-------+ CFV      Full           Yes      Yes                          +---------+---------------+---------+-----------+----------+-------+  SFJ      Full                                                  +---------+---------------+---------+-----------+----------+-------+ FV Prox  Full                                                 +---------+---------------+---------+-----------+----------+-------+ FV Mid   Full                                                 +---------+---------------+---------+-----------+----------+-------+ FV DistalFull                                                 +---------+---------------+---------+-----------+----------+-------+ PFV      Full                                                 +---------+---------------+---------+-----------+----------+-------+ POP      Full           Yes      Yes                          +---------+---------------+---------+-----------+----------+-------+ PTV      Full                                                 +---------+---------------+---------+-----------+----------+-------+ PERO     Full                                                 +---------+---------------+---------+-----------+----------+-------+  Left Venous Findings: +---------+---------------+---------+-----------+----------+-------+          CompressibilityPhasicitySpontaneityPropertiesSummary +---------+---------------+---------+-----------+----------+-------+ CFV      Full           Yes      Yes                          +---------+---------------+---------+-----------+----------+-------+ SFJ      Full                                                 +---------+---------------+---------+-----------+----------+-------+ FV Prox  Full                                                 +---------+---------------+---------+-----------+----------+-------+  FV Mid   Full                                                 +---------+---------------+---------+-----------+----------+-------+ FV DistalFull                                                  +---------+---------------+---------+-----------+----------+-------+ PFV      Full                                                 +---------+---------------+---------+-----------+----------+-------+ POP      Full           Yes      Yes                          +---------+---------------+---------+-----------+----------+-------+ PTV      Full                                                 +---------+---------------+---------+-----------+----------+-------+ PERO     Full                                                 +---------+---------------+---------+-----------+----------+-------+    Summary: Right: There is no evidence of deep vein thrombosis in the lower extremity. No cystic structure found in the popliteal fossa. Left: There is no evidence of deep vein thrombosis in the lower extremity. No cystic structure found in the popliteal fossa.  *See table(s) above for measurements and observations. Electronically signed by Lemar Livings MD on 07/05/2018 at 3:42:01 PM.    Final     PHYSICAL EXAM   Constitutional: Appears well-developed and well-nourished.  Eyes: No scleral injection HENT: No OP obstrucion Head: Normocephalic.  Cardiovascular: Normal rate and regular rhythm.  Respiratory: Effort normal, non-labored breathing on RA GI: Soft.  No distension. There is no tenderness.  Skin: WDI  Neurological Evaluation: Mental Status: Patient is awake, alert, oriented to person, place, month, year, and situation. Patient is able to give a clear and coherent history. No signs of aphasia or neglect Cranial Nerves: II: Visual Fields are full.   III,IV, VI: EOMI without ptosis or diplopia.  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  Pupils are equal, round, and reactive to light. V: Facial sensation is symmetric to temperature VII: Facial movement is symmetric.  VIII: hearing is intact to voice X: No hypophonia XI: Shoulder shrug is symmetric. XII:  tongue is midline without atrophy or fasciculations.  Motor: Tone is normal. Bulk is normal. 5/5 strength was present in all four extremities.  Sensory: Sensation is symmetric to light touch  in the arms and legs. Deep Tendon Reflexes: 2+ and symmetric in the biceps and patellae.  Plantars: Toes are downgoing bilaterally.  Cerebellar: FNF and  HKS are intact bilaterally  ASSESSMENT/PLAN Ms. Analea Hagman is a 48 y.o. female with history of anxiety presenting with word finding difficulty and weakness of the left arm.     Stroke:  right posterior MCA infarct, embolic pattern, could be related to large PFO and ASA in the outpatient  CT/ CTA head & neck  Right M3/M4 branch occlusion in the right parietal lobe   MRI  Acute infarct posterior right MCA territory involving the posterior insula and right parietal lobe in the region of the motor cortex.  TTE EF 55 to 60%  TEE large PFO and ASA  LE dopplers no DVT  LDL 79  HgbA1c 5.3  Lovenox for VTE prophylaxis  No antithrombotic prior to admission, now on aspirin 325 mg daily.  Discussed with Dr. Melburn Popper, given the large PFO and ASA, recommend Eliquis 5 mg twice daily until she sees Dr. Excell Seltzer for PFO closure.  After that antiplatelet regimen as per Dr. Excell Seltzer  Therapy recommendations:  none  Disposition: Home  PFO and ASA  TEE showed large PFO and ASA  Likely the cause for cardioembolic stroke in young patient  Will refer to Dr. Excell Seltzer for PFO closure  Put on Eliquis 5 mg twice daily until she sees Dr. Excell Seltzer  B12 deficiency  B12 level 167  Received a B12 IM 1000 mcg yesterday  Continue B12 p.o. thousand micrograms daily on discharge  Hyperlipidemia  Home meds:  none,   LDL 79 , goal < 70  Add atorvastatin 20mg   Continue statin at discharge  Other Stroke Risk Factors  Family hx stroke (mother)  Other Active Problems  Anxiety: [facility] day # 2  Neurology will sign off. Please call with  questions. Pt will follow up with stroke clinic NP at [facility] in about 4 weeks. Thanks for the consult.   Marvel Plan, MD PhD Stroke Neurology 07/06/2018 3:11 PM     To contact Stroke Continuity provider, please refer to [email or web address]. After hours, contact General Neurology

## 2018-07-06 NOTE — Care Management Note (Addendum)
Case Management Note  Patient Details  Name: Krystal Oneal MRN: [id number] Date of Birth: 20-Aug-1970  Subjective/Objective:   Pt admitted with a stroke. She is from home with her spouse.  DME: none PCP: Dr Cliffton Asters No issue with home medications. No issues with transportation.                 Action/Plan: No f/u per PT/OT and no DME needs. CM following for further d/c needs, physician orders.   Addendum: pt discharging home with self care. Pt d/cing on Eliquis. TOC pharmacy provided her the first 30 days for free. CM provided her $10 co pay card that starts month 2.  Spouse to provide transport home.   Expected Discharge Date:                  Expected Discharge Plan:  Home/Self Care  In-House Referral:     Discharge planning Services     Post Acute Care Choice:    Choice offered to:     DME Arranged:    DME Agency:     HH Arranged:    HH Agency:     Status of Service:  In process, will continue to follow  If discussed at Long Length of Stay Meetings, dates discussed:    Additional Comments:  Kermit Balo, RN 07/06/2018, 12:31 PM

## 2018-07-06 NOTE — Progress Notes (Signed)
  [facility]   MULTIDISCIPLINARY HEART VALVE TEAM  Patient found to have a large PFO. Dr. Excell Seltzer not available in the hospital to see. I have arranged an outpatient appointment to discuss PFO closure on 1/27. Patient is aware and I have placed apt info in chart.   Cline Crock PA-C  [facility]

## 2018-07-06 NOTE — Plan of Care (Signed)
Patient stable, discussed POC with patient, agreeable with plan, denies question/concerns at this time.  

## 2018-07-06 NOTE — CV Procedure (Signed)
    Transesophageal Echocardiogram Note  Krystal Oneal [id number] 1970/09/28  Procedure: Transesophageal Echocardiogram Indications: CVA   Procedure Details Consent: Obtained Time Out: Verified patient identification, verified procedure, site/side was marked, verified correct patient position, special equipment/implants available, Radiology Safety Procedures followed,  medications/allergies/relevent history reviewed, required imaging and test results available.  Performed  Medications:  During this procedure the patient is administered a Propofol drip by CRNA, Pamala Duffel.   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   Right to left shunting was easily seen with bubble contrast   Aorta: normal .    Complications: No apparent complications Patient did tolerate procedure well.   Vesta Mixer, Montez Hageman., MD, [facility] 07/06/2018, 10:00 AM

## 2018-07-06 NOTE — Discharge Summary (Signed)
Physician Discharge Summary  Krystal Oneal YNW:[id number] DOB: 1970/08/13 DOA: 07/04/2018  PCP: Krystal Cherry, MD  Admit date: 07/04/2018 Discharge date: 07/06/2018  Admitted From: Home Disposition: Home  Recommendations for Outpatient Follow-up:  1. Follow up with PCP in 1-2 weeks 2. Follow up with [facility] in 4 weeks 3. Follow up with Cardiology Dr. Excell Seltzer for PFO Closure 4. Please obtain CMP/CBC, Mag, Phos in one week 5. Please follow up on the following pending results: Hypercoagulable Workup   Home Health: No  Equipment/Devices: None  Discharge Condition: Stable  CODE STATUS: FULL CODE Diet recommendation: Heart Healthy Diet  Brief/Interim Summary: HPI per Dr. Jonah Oneal on 07/04/2018 Krystal Oneal a 47 y.o.femalewith medical history significant ofanxiety presenting with dizziness and near syncope.She got dizzy and nauseated with slurred speech and dyscoordination. She was fine this AM until about 10AM. 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 She was dizzy and unable to talk. She finally told him to go back to his desk. She was dropping things on the floor and the teacher came to check on her. She was talking nonsense. She did not have difficulty walking. Her symptoms lasted through arrival in the ER, still having dysarthria. Symptoms finally improved about 2-3pm. Now she feels very tired and sleepy.   ED Course:No risk factors, had a stroke today. Last known normal was 10AM. Neurology is following - back to baseline now but she is an IR candidate until 10AM tomorrow so if symptoms recur please reconsult neuro. Neuro will order an array of labs given lack of RF and young age.  **Underwent full stroke work-up and TEE done today which showed Large PFO and ASA. She was placed on Anticoagulatiion with Eliquis until she sees Dr. Excell Seltzer for PFO closure and was deemed medically stable to D/C Home. Hypercoagulable  work-up was also been initiated by neurology at this time. She will follow up with PCP, Cardiology, and Neurology in the outpatient setting.   Discharge Diagnoses:  Principal Problem:   CVA (cerebral vascular accident) [facility]) Active Problems:   Anxiety  Acute Right Posterior MCA CVA -Patient without known CVD RF presenting with acute onset of neurologic symptoms this AM concerning for CVA -Head CTA Head and Neck showed Right M3/M4 branch occlusion in the right parietal lobe corresponding to acute infarct on MRI. This is presumably an embolus from the heart. No other significant intracranial or extracranial disease. -MRI confirms posterior right MCA CVA likely embolic 2/2 small vessel disease -Symptoms have since improved  -Admitted forfurtherCVA evaluation -C/w Telemetry monitoring -Transthoracic ECHOCardiogram done and showed that the Left Ventricle Cavity size was normal with normal wall thickness and an estimated EF of 55-60% with wall motion normal and no regional wall motion abnormalities. 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 and there was a large obvious PFO with a Right to Left shunting  -Cardiology arranging outpatient appointment to discuss PFO closure on 1/27 -Patient was to have Implantable Loop Recorder placed today but not done after TEE showed Large PFO and ASA -LE dopplers showed no evidence of DVT -Risk stratification with FLP, A1c; will also check TSH and UDS -HbA1c was 5.3, TSH was 1.284, and UDS not done yet  -Lipid Panel showed a level of 134,  HDL 32, LDL 79, triglycerides 117, and VLDL of 23 -Neurology consulted and and appreciate further evaluation and recommendations  -PT/OT/ST/Nutrition Consults recommending no Follow up -No known h/o HTN  but will monitor and allow permissive HTN for now; current BPs are low normal -StartedLipitor 80 mg daily but was changed to Atorvastatin 20 mg po Daily by Neurology  -Allow for Permissive HTN -No known h/o DM - checked A1c and was 5.3 and monitor -Hypercoagulable workup initiated and will need to be followed as an outpatient -Once PFO Closure is done she will need to be placed on Antiplatelet Regimen per Dr. Excell Seltzer -Follow up with [facility] in 4 weeks  Hypophosphatemia  -Patient's Phos Level this AM and repeat was 2.7 -Replete with IV KPhos 20 mmol yesterday -Continue to Monitor and Replete as Necessary -Repeat Phos Level as an outpatient   Anxiety -This is her only know medical problem and she takes prn Klonopin qhs for this issue -C/w Clonazepam 0.5 mg po qHS  B12 Deficiency -B12 Level was 167 -Neurology started Supplementation and gave a Cyanocobalamin 1000 mcg IM once -Will need to continue 1000 mcg po Daily at D/C   Overweight -Estimated body mass index is 26.52 kg/m as calculated from the following:   Height as of this encounter: 5\' 2"  (1.575 m).   Weight as of this encounter: 65.8 kg. -Weight Loss Counseling given   Hyperglycemia -Likely Reactive -HbA1c was 5.3 -Follow up with PCP at D/C   Normocytic Anemia -Hb/Hct went from 13.1/42.1 -> 11.7/37.2 -> 11.1/35.5  -? Dilutional Drop as she was getting NS at 50 mL/hr -Check Anemia Panel in the outpatient setting -Continue to Monitor for S/Sx of Bleeding -Repeat CBC as an outpatient   Discharge Instructions Discharge Instructions    Ambulatory referral to Neurology   Complete by:  As directed    Follow up with stroke clinic NP (Jessica Vanschaick or Darrol Angel, if both not available, consider Manson Allan, or Ahern) at [facility] in about 4 weeks. Thanks.   Call MD for:  difficulty breathing, headache or visual disturbances   Complete by:  As directed    Call MD for:  extreme fatigue   Complete by:  As  directed    Call MD for:  hives   Complete by:  As directed    Call MD for:  persistant dizziness or light-headedness   Complete by:  As directed    Call MD for:  persistant nausea and vomiting   Complete by:  As directed    Call MD for:  redness, tenderness, or signs of infection (pain, swelling, redness, odor or green/yellow discharge around incision site)   Complete by:  As directed    Call MD for:  severe uncontrolled pain   Complete by:  As directed    Call MD for:  temperature >100.4   Complete by:  As directed    Diet - low sodium heart healthy   Complete by:  As directed    Discharge instructions   Complete by:  As directed    You were cared for by a hospitalist during your hospital stay. If you have any questions about your discharge medications or the care you received while you were in the hospital after you are discharged, you can call the unit and ask to speak with the hospitalist on call if the hospitalist that took care of you is not available. Once you are discharged, your primary care physician will handle any further medical issues.  Please note that NO REFILLS for any discharge medications will be authorized once you are discharged, as it is imperative that you return to your primary care physician (or establish a relationship with a primary care physician if you do not have one) for your aftercare needs so that they can reassess your need for medications and monitor your lab values.  Follow up with PCP, Cardiology, and Neurology in the outpatient setting. Take all medications as prescribed. If symptoms change or worsen please return to the ED for evaluation   Increase activity slowly   Complete by:  As directed      Allergies as of 07/06/2018      Reactions   Sulfa Antibiotics Itching   Amoxicillin Hives, Rash   Penicillins Hives, Rash   Did it involve swelling of the face/tongue/throat, SOB, or low BP? Yes Did it involve sudden or severe rash/hives, skin peeling, or  any reaction on the inside of your mouth or nose? Yes Did you need to seek medical attention at a hospital or doctor's office? No When did it last happen?2 years ago If all above answers are "NO", may proceed with cephalosporin use.      Medication List    TAKE these medications   apixaban 5 MG Tabs tablet Commonly known as:  ELIQUIS Take 1 tablet (5 mg total) by mouth 2 (two) times daily.   atorvastatin 20 MG tablet Commonly known as:  LIPITOR Take 1 tablet (20 mg total) by mouth daily at 6 PM.   clonazePAM 0.5 MG tablet Commonly known as:  KLONOPIN Take 0.5 mg by mouth daily.   cyanocobalamin 1000 MCG tablet Take 1 tablet (1,000 mcg total) by mouth daily. Start taking on:  July 07, 2018   loratadine 10 MG tablet Commonly known as:  CLARITIN Take 10 mg by mouth daily.   multivitamin with minerals Tabs tablet Take 1 tablet by mouth daily.   PROBIOTIC DAILY PO Take 1 tablet by mouth daily.      Follow-up Information    Tonny Bollmanooper, Michael, MD. Go on 07/16/2018.   Specialty:  Cardiology Why:  3:40pm to talk about the hole in your heart and options for closure. Please arrive at least 10 minutes early.  Contact information: [address] [place] [address] [phone]        Krystal CherryWhyte, Thomas M, MD. Call.   Specialty:  Family Medicine Why:  Follow up within 1 week Contact information: [address] [place] [address] [phone]        [facility]. Schedule an appointment as soon as possible for a visit in 4 week(s).   Specialty:  Neurology Contact information: [address] [place] [id number] [phone]         Allergies  Allergen Reactions  . Sulfa Antibiotics Itching  . Amoxicillin Hives and Rash  . Penicillins Hives and Rash    Did it involve swelling of the face/tongue/throat, SOB, or low BP? Yes Did it involve sudden or severe rash/hives, skin peeling, or  any reaction on the inside of your mouth or nose? Yes Did you need to seek medical attention at a hospital or doctor's office? No When did it last happen?2 years ago If all above answers are "NO", may proceed with cephalosporin use.    Consultations:  Neurology  EP Cardiology  Procedures/Studies: Ct Angio Head W Or Wo Contrast  Result Date: 07/04/2018 CLINICAL DATA:  Code stroke.  Aphasia.  Left-sided weakness. EXAM: CT ANGIOGRAPHY HEAD AND NECK TECHNIQUE: Multidetector CT imaging of the head and neck was performed using the standard protocol during bolus administration of intravenous contrast. Multiplanar CT image reconstructions and MIPs were obtained to evaluate the vascular anatomy. Carotid stenosis measurements (when applicable) are obtained utilizing NASCET criteria, using the distal internal carotid diameter as the denominator. CONTRAST:  75mL ISOVUE-370 IOPAMIDOL (ISOVUE-370) INJECTION 76% COMPARISON:  MRI 07/04/2018 FINDINGS: CT HEAD FINDINGS Brain: No evidence of acute infarction, hemorrhage, hydrocephalus, extra-axial collection or mass lesion/mass effect. Vascular: Negative for hyperdense vessel Skull: Negative Sinuses: Negative Orbits: Negative Review of the MIP images confirms the above findings CTA NECK FINDINGS Aortic arch: Normal aortic arch. Negative for dissection or atherosclerotic disease. Left vertebral artery origin from the arch Right carotid system: Normal right carotid without stenosis or irregularity. Left carotid system: Normal left carotid without stenosis or irregularity Vertebral arteries: Both vertebral arteries widely patent without stenosis or irregularity. Left vertebral artery origin from the aortic arch. Skeleton: Negative Other neck: Negative Upper chest: Negative Review of the MIP images confirms the above findings CTA HEAD FINDINGS Anterior circulation: Cavernous carotid normal bilaterally. Proximal anterior and middle cerebral arteries widely patent  bilaterally without stenosis. Occluded branch of distal right MCA in the right parietal region. This corresponds to right M3/M4 occluded segment and corresponds to the area of restricted diffusion on MRI. This is likely a small embolus. No other branch occlusions. Posterior circulation: Both vertebral arteries widely patent to the basilar. PICA patent bilaterally. Basilar widely patent. AICA, superior cerebellar, and posterior cerebral arteries widely patent Venous sinuses: Patent Anatomic variants: None Delayed phase: Not performed Review of the MIP images confirms the above findings IMPRESSION: 1. Right M3/M4 branch occlusion in the right parietal lobe corresponding to acute infarct on MRI. This is presumably an embolus from the heart. No other significant intracranial or extracranial disease. 2. These results were called by telephone at the time of interpretation on 07/04/2018 at 4:09 pm to Dr. Otelia Limes , who verbally acknowledged these results. Electronically Signed   By: Marlan Palau M.D.   On: 07/04/2018 16:10   Ct Angio Neck W And/or Wo Contrast  Result Date: 07/04/2018 CLINICAL DATA:  Code stroke.  Aphasia.  Left-sided weakness. EXAM: CT ANGIOGRAPHY HEAD AND NECK TECHNIQUE: Multidetector CT imaging of the head and neck was performed using the standard protocol during bolus administration of intravenous contrast. Multiplanar CT image reconstructions and MIPs were obtained to evaluate the vascular anatomy. Carotid stenosis measurements (when applicable) are obtained utilizing NASCET criteria, using the distal internal carotid diameter as the denominator. CONTRAST:  75mL ISOVUE-370 IOPAMIDOL (ISOVUE-370) INJECTION 76% COMPARISON:  MRI 07/04/2018 FINDINGS: CT HEAD FINDINGS Brain: No evidence of acute infarction, hemorrhage, hydrocephalus, extra-axial collection or mass lesion/mass effect. Vascular: Negative for hyperdense vessel Skull: Negative Sinuses: Negative Orbits: Negative Review of the MIP images  confirms the above findings CTA NECK FINDINGS Aortic arch: Normal aortic arch. Negative for dissection or atherosclerotic disease. Left vertebral artery origin from the arch Right carotid system: Normal right carotid without stenosis or irregularity. Left carotid system: Normal left carotid without stenosis or irregularity Vertebral arteries: Both vertebral arteries widely patent without stenosis or irregularity. Left vertebral artery origin from the aortic arch. Skeleton: Negative Other neck: Negative Upper chest: Negative Review of the MIP images confirms the above findings CTA HEAD FINDINGS Anterior circulation: Cavernous carotid normal bilaterally. Proximal anterior and middle cerebral arteries widely patent bilaterally without stenosis. Occluded branch of distal right MCA in  the right parietal region. This corresponds to right M3/M4 occluded segment and corresponds to the area of restricted diffusion on MRI. This is likely a small embolus. No other branch occlusions. Posterior circulation: Both vertebral arteries widely patent to the basilar. PICA patent bilaterally. Basilar widely patent. AICA, superior cerebellar, and posterior cerebral arteries widely patent Venous sinuses: Patent Anatomic variants: None Delayed phase: Not performed Review of the MIP images confirms the above findings IMPRESSION: 1. Right M3/M4 branch occlusion in the right parietal lobe corresponding to acute infarct on MRI. This is presumably an embolus from the heart. No other significant intracranial or extracranial disease. 2. These results were called by telephone at the time of interpretation on 07/04/2018 at 4:09 pm to Dr. Otelia Limes , who verbally acknowledged these results. Electronically Signed   By: Marlan Palau M.D.   On: 07/04/2018 16:10   Mr Brain Wo Contrast  Result Date: 07/04/2018 CLINICAL DATA:  Left arm weakness, aphasia. EXAM: MRI HEAD WITHOUT CONTRAST TECHNIQUE: Multiplanar, multiecho pulse sequences of the brain and  surrounding structures were obtained without intravenous contrast. COMPARISON:  MRI head 06/26/2003 FINDINGS: Brain: Restricted diffusion in the right parietal lobe compatible with acute infarct. This involves region of the motor strep as well as the high medial parietal lobe. Small area of acute infarct extends into the posterior insula on the right. FLAIR imaging negative for edema. Ventricle size normal. No significant chronic ischemia. Negative for mass lesion. Vascular: Small area of susceptibility in the right parietal lobe probable area of right MCA clot. There appears to be increased signal in the right parietal branch in this area suggesting slow flow. Skull and upper cervical spine: Negative Sinuses/Orbits: Negative Other: Mild IMPRESSION: Acute infarct posterior right MCA territory involving the posterior insula and right parietal lobe in the region of the motor cortex. Negative FLAIR imaging. Probable clot in distal right MCA branch versus focal microhemorrhage. Increased flow in this vessel on FLAIR suggests slow flow. These results were called by telephone at the time of interpretation on 07/04/2018 at 3:00 pm to Dr. Particia Nearing, who verbally acknowledged these results. Electronically Signed   By: Marlan Palau M.D.   On: 07/04/2018 15:01   Vas [place] Lower Extremity Venous (dvt)  Result Date: 07/05/2018  Lower Venous Study Indications: Stroke.  Performing Technologist: Blanch Media RVS  Examination Guidelines: A complete evaluation includes B-mode imaging, spectral Doppler, color Doppler, and power Doppler as needed of all accessible portions of each vessel. Bilateral testing is considered an integral part of a complete examination. Limited examinations for reoccurring indications may be performed as noted.  Right Venous Findings: +---------+---------------+---------+-----------+----------+-------+          CompressibilityPhasicitySpontaneityPropertiesSummary  +---------+---------------+---------+-----------+----------+-------+ CFV      Full           Yes      Yes                          +---------+---------------+---------+-----------+----------+-------+ SFJ      Full                                                 +---------+---------------+---------+-----------+----------+-------+ FV Prox  Full                                                 +---------+---------------+---------+-----------+----------+-------+  FV Mid   Full                                                 +---------+---------------+---------+-----------+----------+-------+ FV DistalFull                                                 +---------+---------------+---------+-----------+----------+-------+ PFV      Full                                                 +---------+---------------+---------+-----------+----------+-------+ POP      Full           Yes      Yes                          +---------+---------------+---------+-----------+----------+-------+ PTV      Full                                                 +---------+---------------+---------+-----------+----------+-------+ PERO     Full                                                 +---------+---------------+---------+-----------+----------+-------+  Left Venous Findings: +---------+---------------+---------+-----------+----------+-------+          CompressibilityPhasicitySpontaneityPropertiesSummary +---------+---------------+---------+-----------+----------+-------+ CFV      Full           Yes      Yes                          +---------+---------------+---------+-----------+----------+-------+ SFJ      Full                                                 +---------+---------------+---------+-----------+----------+-------+ FV Prox  Full                                                  +---------+---------------+---------+-----------+----------+-------+ FV Mid   Full                                                 +---------+---------------+---------+-----------+----------+-------+ FV DistalFull                                                 +---------+---------------+---------+-----------+----------+-------+  PFV      Full                                                 +---------+---------------+---------+-----------+----------+-------+ POP      Full           Yes      Yes                          +---------+---------------+---------+-----------+----------+-------+ PTV      Full                                                 +---------+---------------+---------+-----------+----------+-------+ PERO     Full                                                 +---------+---------------+---------+-----------+----------+-------+    Summary: Right: There is no evidence of deep vein thrombosis in the lower extremity. No cystic structure found in the popliteal fossa. Left: There is no evidence of deep vein thrombosis in the lower extremity. No cystic structure found in the popliteal fossa.  *See table(s) above for measurements and observations. Electronically signed by Lemar LivingsBrandon Cain MD on 07/05/2018 at 3:42:01 PM.    Final    Bilateral LE Venous Duplex Right: There is no evidence of deep vein thrombosis in the lower extremity. No cystic structure found in the popliteal fossa. Left: There is no evidence of deep vein thrombosis in the lower extremity. No cystic structure found in the popliteal fossa.  ECHOCARDIOGRAM  Study Conclusions - Left ventricle: The cavity size was normal. Wall thickness was normal. Systolic function was normal. The estimated ejection fraction was in the range of 55% to 60%. Wall motion was normal; there were no regional wall motion abnormalities. Left ventricular diastolic function parameters were normal. Impressions: -  Normal LV systolic and diastolic function.  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 Right to left shunting was easily seen with bubble contrast   Aorta:normal .  Subjective: Examined at bedside was doing well.  Denied any chest pain, lightheadedness or dizziness.  No nausea or vomiting.  Had a episode of "swimmy headedness" today but this was after her clonazepam was given to her.  No recurrence today.  No other concerns or plans at this time and she is deemed medically stable to be discharged home will need to follow-up with PCP, cardiology and neurology in outpatient setting and she is understanding and agreeable with the plan.  Discharge Exam: Vitals:   07/06/18 1020 07/06/18 1043  BP: 109/60 (!) 105/59  Pulse: 77 (!) 57  Resp: 20 18  Temp:  97.7 F (36.5 C)  SpO2: 98% 99%   Vitals:   07/06/18 1006 07/06/18 1010 07/06/18 1020 07/06/18 1043  BP: (!) 101/58 (!) 101/58 109/60 (!) 105/59  Pulse:  76 73 77 (!) 57  Resp: 12 16 20 18   Temp: 97.9 F (36.6 C)   97.7 F (36.5 C)  TempSrc: Oral   Oral  SpO2: 96% 96% 98% 99%  Weight:      Height:       General: Pt is alert, awake, not in acute distress Cardiovascular: RRR, S1/S2 +, no rubs, no gallops Respiratory: Diminished bilaterally, no wheezing, no rhonchi Abdominal: Soft, NT, ND, bowel sounds + Extremities: no edema, no cyanosis  The results of significant diagnostics from this hospitalization (including imaging, microbiology, ancillary and laboratory) are listed below for reference.    Microbiology: No results found for this or any previous visit (from the past 240 hour(s)).   Labs: BNP (last 3 results) No results for input(s): BNP in  the last 8760 hours. Basic Metabolic Panel: Recent Labs  Lab 07/04/18 1216 07/05/18 0946 07/06/18 0653  NA 140 142 141  K 3.7 4.5 4.2  CL 108 112* 113*  CO2 21* 23 22  GLUCOSE 154* 104* 101*  BUN 6 <5* 5*  CREATININE 0.63 0.66 0.68  CALCIUM 9.1 8.3* 8.3*  MG  --  1.9 1.9  PHOS  --  1.9* 2.7   Liver Function Tests: Recent Labs  Lab 07/04/18 1216 07/05/18 0946 07/06/18 0653  AST 21 18 16   ALT 18 13 14   ALKPHOS 66 56 53  BILITOT 0.9 0.9 0.5  PROT 6.6 5.8* 5.4*  ALBUMIN 4.0 3.3* 3.2*   No results for input(s): LIPASE, AMYLASE in the last 168 hours. No results for input(s): AMMONIA in the last 168 hours. CBC: Recent Labs  Lab 07/04/18 1216 07/05/18 0946 07/06/18 0653  WBC 7.8 8.0 6.9  NEUTROABS 5.4 4.8 3.9  HGB 13.1 11.7* 11.1*  HCT 42.1 37.2 35.5*  MCV 94.4 94.2 93.9  PLT 260 241 212   Cardiac Enzymes: No results for input(s): CKTOTAL, CKMB, CKMBINDEX, TROPONINI in the last 168 hours. BNP: Invalid input(s): POCBNP CBG: No results for input(s): GLUCAP in the last 168 hours. D-Dimer No results for input(s): DDIMER in the last 72 hours. Hgb A1c Recent Labs    07/05/18 0616  HGBA1C 5.3   Lipid Profile Recent Labs    07/05/18 0616  CHOL 134  HDL 32*  LDLCALC 79  TRIG 924  CHOLHDL 4.2   Thyroid function studies Recent Labs    07/04/18 1750  TSH 1.284   Anemia work up Recent Labs    07/05/18 0616  VITAMINB12 167*   Urinalysis No results found for: COLORURINE, APPEARANCEUR, LABSPEC, PHURINE, GLUCOSEU, HGBUR, BILIRUBINUR, KETONESUR, PROTEINUR, UROBILINOGEN, NITRITE, LEUKOCYTESUR Sepsis Labs Invalid input(s): PROCALCITONIN,  WBC,  LACTICIDVEN Microbiology No results found for this or any previous visit (from the past 240 hour(s)).  Time coordinating discharge: 35 minutes  SIGNED:  Merlene Laughter, DO [facility] 07/06/2018, 4:14 PM Pager is on AMION  If 7PM-7AM, please contact night-coverage [email or web address] Password [email or web address]

## 2018-07-09 ENCOUNTER — Encounter (HOSPITAL_COMMUNITY): Payer: Self-pay | Admitting: Cardiovascular Disease

## 2018-07-16 ENCOUNTER — Ambulatory Visit: Payer: BC Managed Care – PPO | Admitting: Cardiovascular Disease

## 2018-07-16 ENCOUNTER — Encounter: Payer: Self-pay | Admitting: Cardiovascular Disease

## 2018-07-16 VITALS — BP 108/72 | HR 102 | Ht 62.0 in | Wt 143.1 lb

## 2018-07-16 DIAGNOSIS — Q2112 Patent foramen ovale: Secondary | ICD-10-CM

## 2018-07-16 DIAGNOSIS — Q211 Atrial septal defect: Secondary | ICD-10-CM | POA: Diagnosis not present

## 2018-07-16 MED ORDER — CLOPIDOGREL BISULFATE 75 MG PO TABS: 75.0000 mg | ORAL_TABLET | Freq: Every day | ORAL | 3 refills | Status: AC

## 2018-07-16 MED ORDER — ASPIRIN EC 81 MG PO TBEC: 81.0000 mg | DELAYED_RELEASE_TABLET | Freq: Every day | ORAL | 3 refills | Status: AC

## 2018-07-16 NOTE — H&P (View-Only) (Signed)
Cardiology Office Note:    Date:  07/18/2018   ID:  Latiqua Sayegh, DOB 01/17/1971, MRN [id number]  PCP:  Whyte, Thomas M, MD  Cardiologist:  No primary care provider on file.  Electrophysiologist:  None   Referring MD: Whyte, Thomas M, MD   Chief Complaint  Patient presents with  . PFO evaluation    History of Present Illness:    Krystal Oneal is a 48 y.o. female referred by Dr Xu for consideration of PFO closure after recently presenting with word finding difficulty and left arm weakness.  A CTA of the head and neck demonstrated branch occlusion of the right M3 and M4 branches in the parietal lobe.  An MRI showed acute infarct in the posterior right MCA territory suspicious for cardioembolic event.  The patient is essentially back to baseline with the exception of generalized fatigue.  Her neurologic symptoms have resolved.  During her hospitalization she underwent multiple imaging studies demonstrating no evidence of large vessel atherosclerotic disease.  Her echocardiogram showed normal LV function, no significant valvular disease, and no right heart dysfunction.  She underwent a TEE which demonstrated a large PFO with spontaneous right to left shunting.  She was placed on oral anticoagulation with apixaban.  She is here today to discuss consideration of PFO closure.  The patient specifically denies any history of chest pain, chest pressure, shortness of breath, edema, heart palpitations, lightheadedness, or syncope.  She feels like she has recovered fairly well from her stroke symptoms which lasted for about 6 hours.  Past Medical History:  Diagnosis Date  . Anxiety   . CVA (cerebral vascular accident) (HCC) 07/04/2018    Past Surgical History:  Procedure Laterality Date  . HYDRADENITIS EXCISION    . TEE WITHOUT CARDIOVERSION N/A 07/06/2018   Procedure: TRANSESOPHAGEAL ECHOCARDIOGRAM (TEE);  Surgeon: Nahser, Philip J, MD;  Location: [facility];  Service: Cardiovascular;   Laterality: N/A;    Current Medications: Current Meds  Medication Sig  . atorvastatin (LIPITOR) 20 MG tablet Take 1 tablet (20 mg total) by mouth daily at 6 PM.  . Calcium Carb-Cholecalciferol (CALCIUM 600 + D PO) Take 1 tablet by mouth daily.  . clonazePAM (KLONOPIN) 0.5 MG tablet Take 0.5 mg by mouth at bedtime.   . loratadine (CLARITIN) 10 MG tablet Take 10 mg by mouth daily.  . Multiple Vitamin (MULTIVITAMIN WITH MINERALS) TABS tablet Take 1 tablet by mouth daily.  . nicotine polacrilex (NICORETTE) 4 MG gum Take 4 mg by mouth as needed for smoking cessation.  . Probiotic Product (PROBIOTIC DAILY PO) Take 1 tablet by mouth daily.  . vitamin B-12 1000 MCG tablet Take 1 tablet (1,000 mcg total) by mouth daily.  . [DISCONTINUED] apixaban (ELIQUIS) 5 MG TABS tablet Take 1 tablet (5 mg total) by mouth 2 (two) times daily.     Allergies:   Sulfa antibiotics; Amoxicillin; and Penicillins   Social History   Socioeconomic History  . Marital status: Married    Spouse name: Not on file  . Number of children: Not on file  . Years of education: Not on file  . Highest education level: Not on file  Occupational History  . Occupation: teacher assistant  Social Needs  . Financial resource strain: Not on file  . Food insecurity:    Worry: Not on file    Inability: Not on file  . Transportation needs:    Medical: Not on file    Non-medical: Not on file  Tobacco Use  .   Smoking status: Former Smoker    Packs/day: 1.00    Years: 30.00    Pack years: 30.00    Types: Cigarettes    Last attempt to quit: 2017    Years since quitting: 3.0  . Smokeless tobacco: Never Used  Substance and Sexual Activity  . Alcohol use: Never    Frequency: Never  . Drug use: Never  . Sexual activity: Not on file  Lifestyle  . Physical activity:    Days per week: Not on file    Minutes per session: Not on file  . Stress: Not on file  Relationships  . Social connections:    Talks on phone: Not on file     Gets together: Not on file    Attends religious service: Not on file    Active member of club or organization: Not on file    Attends meetings of clubs or organizations: Not on file    Relationship status: Not on file  Other Topics Concern  . Not on file  Social History Narrative  . Not on file     Family History: The patient's family history includes CVA (age of onset: 88) in her mother; Hypertension in her father and mother.  ROS:   Please see the history of present illness.    Positive for generalized fatigue.  All other systems reviewed and are negative.  EKGs/Labs/Other Studies Reviewed:    The following studies were reviewed today: TEE: Study Conclusions  - Left ventricle: Systolic function was normal. The estimated   ejection fraction was in the range of 60% to 65%. Wall motion was   normal; there were no regional wall motion abnormalities. - Aortic valve: No evidence of vegetation. - Mitral valve: No evidence of vegetation. - Left atrium: No evidence of thrombus in the atrial cavity or   appendage. - Atrial septum: There was a large patent foramen ovale. - Tricuspid valve: No evidence of vegetation.  CTA Head and Neck: IMPRESSION: 1. Right M3/M4 branch occlusion in the right parietal lobe corresponding to acute infarct on MRI. This is presumably an embolus from the heart. No other significant intracranial or extracranial disease.  MRI Brain: IMPRESSION: Acute infarct posterior right MCA territory involving the posterior insula and right parietal lobe in the region of the motor cortex. Negative FLAIR imaging.  Probable clot in distal right MCA branch versus focal microhemorrhage. Increased flow in this vessel on FLAIR suggests slow flow.  Recent Labs: 07/04/2018: TSH 1.284 07/06/2018: ALT 14; BUN 5; Creatinine, Ser 0.68; Hemoglobin 11.1; Magnesium 1.9; Platelets 212; Potassium 4.2; Sodium 141  Recent Lipid Panel    Component Value Date/Time   CHOL 134  07/05/2018 0616   TRIG 117 07/05/2018 0616   HDL 32 (L) 07/05/2018 0616   CHOLHDL 4.2 07/05/2018 0616   VLDL 23 07/05/2018 0616   LDLCALC 79 07/05/2018 0616    Physical Exam:    VS:  BP 108/72   Pulse (!) 102   Ht 5' 2" (1.575 m)   Wt 143 lb 1.9 oz (64.9 kg)   LMP 07/05/2018   SpO2 98%   BMI 26.18 kg/m     Wt Readings from Last 3 Encounters:  07/16/18 143 lb 1.9 oz (64.9 kg)  07/04/18 145 lb (65.8 kg)     GEN:  Well nourished, well developed in no acute distress HEENT: Normal NECK: No JVD; No carotid bruits LYMPHATICS: No lymphadenopathy CARDIAC: RRR, no murmurs, rubs, gallops RESPIRATORY:  Clear to auscultation without rales,   wheezing or rhonchi  ABDOMEN: Soft, non-tender, non-distended MUSCULOSKELETAL:  No edema; No deformity  SKIN: Warm and dry NEUROLOGIC:  Alert and oriented x 3 PSYCHIATRIC:  Normal affect   ASSESSMENT:    1. PFO (patent foramen ovale)    PLAN:    In order of problems listed above:  Radiographic results, hospital notes, lab results, and cardiac imaging data are reviewed. TEE images are reviewed and demonstrate normal LV and RV function, no significant valvular disease, and a large PFO with spontaneous right to left shunting demonstrated by color Doppler and agitated saline.  The patient's Rope Score is 8, indicating a high probability that the patient's stroke is 'PFO-related.' The patient is counseled about the association of PFO and cryptogenic stroke. Available clinical trial data is reviewed, specifically those trials comparing transcatheter PFO closure and medical therapy with antiplatelet drugs. The patient understands the potential benefit of PFO closure with respect to secondary stroke reduction compared with medical therapy alone. Specific risks of transcatheter PFO closure are reviewed with the patient. These risks include bleeding, infection, device embolization, stroke, cardiac perforation, tamponade, arrhythmia, MI, and late device  erosion. She understands these serious risks occur at low incidence of < 1%.   Her medications are reviewed and she will stop apixaban, transitioning to aspirin and clopidogrel just prior to the procedure.  She understands that she will require aspirin and clopidogrel for at least 3 months after PFO closure, then aspirin long-term.   Medication Adjustments/Labs and Tests Ordered: Current medicines are reviewed at length with the patient today.  Concerns regarding medicines are outlined above.  No orders of the defined types were placed in this encounter.  Meds ordered this encounter  Medications  . aspirin EC 81 MG tablet    Sig: Take 1 tablet (81 mg total) by mouth daily.    Dispense:  90 tablet    Refill:  3  . clopidogrel (PLAVIX) 75 MG tablet    Sig: Take 1 tablet (75 mg total) by mouth daily.    Dispense:  90 tablet    Refill:  3    Patient Instructions  Medication Instructions:  1) take your last dose of ELIQUIS this Friday, January 31 2) On Saturday, Feb 1, START ASPIRIN 81 mg daily 3) On Saturday, Feb 1, START PLAVIX 75 mg daily  Labwork: None needed  Testing/Procedures: Dr. Liston Thum recommends you have a PFO CLOSURE. Instructions are below.  Follow-Up: You have your follow-up appointment scheduled August 29, 2018 at 3:30PM.  Any Other Special Instructions Will Be Listed Below (If Applicable).  PFO CLOSURE INSTRUCTIONS: You are scheduled for a PFO CLOSURE on Wednesday, February 5 with Dr. Heavin Sebree.  1. Please arrive at the [facility] (Main Entrance A) at [facility]: [address] [place], [place] [address] at 8:30 AM (This time is two hours before your procedure to ensure your preparation). Free valet parking service is available.   Special note: Every effort is made to have your procedure done on time. Please understand that emergencies sometimes delay scheduled procedures.  2. Diet: Do not eat solid foods after midnight.  The patient may have clear  liquids until 5am upon the day of the procedure. Make sure to stay well hydrated the day before your procedure!  3. Labs: None needed.  4. Medication instructions in preparation for your procedure:  1) Make sure to take your aspirin and Plavix the morning of your procedure.  2) you may take your other medications as   directed with sips of water.  5. Plan for one night stay--bring personal belongings. 6. Bring a current list of your medications and current insurance cards. 7. You MUST have a responsible person to drive you home. 8. Someone MUST be with you the first 24 hours after you arrive home or your discharge will be delayed. 9. Please wear clothes that are easy to get on and off and wear slip-on shoes.  Thank you for allowing us to care for you!   -- [place] [facility]     Signed, Jairy Angulo, MD  07/18/2018 1:47 PM    [facility] 

## 2018-07-16 NOTE — Progress Notes (Signed)
Cardiology Office Note:    Date:  07/18/2018   ID:  Krystal Oneal, DOB November 24, 1970, MRN [id number]  PCP:  Everlean CherryWhyte, Thomas M, MD  Cardiologist:  No primary care provider on file.  Electrophysiologist:  None   Referring MD: Everlean CherryWhyte, Thomas M, MD   Chief Complaint  Patient presents with  . PFO evaluation    History of Present Illness:    Krystal Oneal is a 48 y.o. female referred by Dr Roda ShuttersXu for consideration of PFO closure after recently presenting with word finding difficulty and left arm weakness.  A CTA of the head and neck demonstrated branch occlusion of the right M3 and M4 branches in the parietal lobe.  An MRI showed acute infarct in the posterior right MCA territory suspicious for cardioembolic event.  The patient is essentially back to baseline with the exception of generalized fatigue.  Her neurologic symptoms have resolved.  During her hospitalization she underwent multiple imaging studies demonstrating no evidence of large vessel atherosclerotic disease.  Her echocardiogram showed normal LV function, no significant valvular disease, and no right heart dysfunction.  She underwent a TEE which demonstrated a large PFO with spontaneous right to left shunting.  She was placed on oral anticoagulation with apixaban.  She is here today to discuss consideration of PFO closure.  The patient specifically denies any history of chest pain, chest pressure, shortness of breath, edema, heart palpitations, lightheadedness, or syncope.  She feels like she has recovered fairly well from her stroke symptoms which lasted for about 6 hours.  Past Medical History:  Diagnosis Date  . Anxiety   . CVA (cerebral vascular accident) (HCC) 07/04/2018    Past Surgical History:  Procedure Laterality Date  . HYDRADENITIS EXCISION    . TEE WITHOUT CARDIOVERSION N/A 07/06/2018   Procedure: TRANSESOPHAGEAL ECHOCARDIOGRAM (TEE);  Surgeon: Vesta MixerNahser, Philip J, MD;  [facility];  Service: Cardiovascular;   Laterality: N/A;    Current Medications: Current Meds  Medication Sig  . atorvastatin (LIPITOR) 20 MG tablet Take 1 tablet (20 mg total) by mouth daily at 6 PM.  . Calcium Carb-Cholecalciferol (CALCIUM 600 + D PO) Take 1 tablet by mouth daily.  . clonazePAM (KLONOPIN) 0.5 MG tablet Take 0.5 mg by mouth at bedtime.   Marland Kitchen. loratadine (CLARITIN) 10 MG tablet Take 10 mg by mouth daily.  . Multiple Vitamin (MULTIVITAMIN WITH MINERALS) TABS tablet Take 1 tablet by mouth daily.  . nicotine polacrilex (NICORETTE) 4 MG gum Take 4 mg by mouth as needed for smoking cessation.  . Probiotic Product (PROBIOTIC DAILY PO) Take 1 tablet by mouth daily.  . vitamin B-12 1000 MCG tablet Take 1 tablet (1,000 mcg total) by mouth daily.  . [DISCONTINUED] apixaban (ELIQUIS) 5 MG TABS tablet Take 1 tablet (5 mg total) by mouth 2 (two) times daily.     Allergies:   Sulfa antibiotics; Amoxicillin; and Penicillins   Social History   Socioeconomic History  . Marital status: Married    Spouse name: Not on file  . Number of children: Not on file  . Years of education: Not on file  . Highest education level: Not on file  Occupational History  . Occupation: Geologist, engineeringteacher assistant  Social Needs  . Financial resource strain: Not on file  . Food insecurity:    Worry: Not on file    Inability: Not on file  . Transportation needs:    Medical: Not on file    Non-medical: Not on file  Tobacco Use  .  Smoking status: Former Smoker    Packs/day: 1.00    Years: 30.00    Pack years: 30.00    Types: Cigarettes    Last attempt to quit: 2017    Years since quitting: 3.0  . Smokeless tobacco: Never Used  Substance and Sexual Activity  . Alcohol use: Never    Frequency: Never  . Drug use: Never  . Sexual activity: Not on file  Lifestyle  . Physical activity:    Days per week: Not on file    Minutes per session: Not on file  . Stress: Not on file  Relationships  . Social connections:    Talks on phone: Not on file     Gets together: Not on file    Attends religious service: Not on file    Active member of club or organization: Not on file    Attends meetings of clubs or organizations: Not on file    Relationship status: Not on file  Other Topics Concern  . Not on file  Social History Narrative  . Not on file     Family History: The patient's family history includes CVA (age of onset: 1688) in her mother; Hypertension in her father and mother.  ROS:   Please see the history of present illness.    Positive for generalized fatigue.  All other systems reviewed and are negative.  EKGs/Labs/Other Studies Reviewed:    The following studies were reviewed today: TEE: Study Conclusions  - Left ventricle: Systolic function was normal. The estimated   ejection fraction was in the range of 60% to 65%. Wall motion was   normal; there were no regional wall motion abnormalities. - Aortic valve: No evidence of vegetation. - Mitral valve: No evidence of vegetation. - Left atrium: No evidence of thrombus in the atrial cavity or   appendage. - Atrial septum: There was a large patent foramen ovale. - Tricuspid valve: No evidence of vegetation.  CTA Head and Neck: IMPRESSION: 1. Right M3/M4 branch occlusion in the right parietal lobe corresponding to acute infarct on MRI. This is presumably an embolus from the heart. No other significant intracranial or extracranial disease.  MRI Brain: IMPRESSION: Acute infarct posterior right MCA territory involving the posterior insula and right parietal lobe in the region of the motor cortex. Negative FLAIR imaging.  Probable clot in distal right MCA branch versus focal microhemorrhage. Increased flow in this vessel on FLAIR suggests slow flow.  Recent Labs: 07/04/2018: TSH 1.284 07/06/2018: ALT 14; BUN 5; Creatinine, Ser 0.68; Hemoglobin 11.1; Magnesium 1.9; Platelets 212; Potassium 4.2; Sodium 141  Recent Lipid Panel    Component Value Date/Time   CHOL 134  07/05/2018 0616   TRIG 117 07/05/2018 0616   HDL 32 (L) 07/05/2018 0616   CHOLHDL 4.2 07/05/2018 0616   VLDL 23 07/05/2018 0616   LDLCALC 79 07/05/2018 0616    Physical Exam:    VS:  BP 108/72   Pulse (!) 102   Ht 5\' 2"  (1.575 m)   Wt 143 lb 1.9 oz (64.9 kg)   LMP 07/05/2018   SpO2 98%   BMI 26.18 kg/m     Wt Readings from Last 3 Encounters:  07/16/18 143 lb 1.9 oz (64.9 kg)  07/04/18 145 lb (65.8 kg)     GEN:  Well nourished, well developed in no acute distress HEENT: Normal NECK: No JVD; No carotid bruits LYMPHATICS: No lymphadenopathy CARDIAC: RRR, no murmurs, rubs, gallops RESPIRATORY:  Clear to auscultation without rales,  wheezing or rhonchi  ABDOMEN: Soft, non-tender, non-distended MUSCULOSKELETAL:  No edema; No deformity  SKIN: Warm and dry NEUROLOGIC:  Alert and oriented x 3 PSYCHIATRIC:  Normal affect   ASSESSMENT:    1. PFO (patent foramen ovale)    PLAN:    In order of problems listed above:  Radiographic results, hospital notes, lab results, and cardiac imaging data are reviewed. TEE images are reviewed and demonstrate normal LV and RV function, no significant valvular disease, and a large PFO with spontaneous right to left shunting demonstrated by color Doppler and agitated saline.  The patient's Rope Score is 8, indicating a high probability that the patient's stroke is 'PFO-related.' The patient is counseled about the association of PFO and cryptogenic stroke. Available clinical trial data is reviewed, specifically those trials comparing transcatheter PFO closure and medical therapy with antiplatelet drugs. The patient understands the potential benefit of PFO closure with respect to secondary stroke reduction compared with medical therapy alone. Specific risks of transcatheter PFO closure are reviewed with the patient. These risks include bleeding, infection, device embolization, stroke, cardiac perforation, tamponade, arrhythmia, MI, and late device  erosion. She understands these serious risks occur at low incidence of < 1%.   Her medications are reviewed and she will stop apixaban, transitioning to aspirin and clopidogrel just prior to the procedure.  She understands that she will require aspirin and clopidogrel for at least 3 months after PFO closure, then aspirin long-term.   Medication Adjustments/Labs and Tests Ordered: Current medicines are reviewed at length with the patient today.  Concerns regarding medicines are outlined above.  No orders of the defined types were placed in this encounter.  Meds ordered this encounter  Medications  . aspirin EC 81 MG tablet    Sig: Take 1 tablet (81 mg total) by mouth daily.    Dispense:  90 tablet    Refill:  3  . clopidogrel (PLAVIX) 75 MG tablet    Sig: Take 1 tablet (75 mg total) by mouth daily.    Dispense:  90 tablet    Refill:  3    Patient Instructions  Medication Instructions:  1) take your last dose of ELIQUIS this Friday, January 31 2) On Saturday, Feb 1, START ASPIRIN 81 mg daily 3) On Saturday, Feb 1, START PLAVIX 75 mg daily  Labwork: None needed  Testing/Procedures: Dr. Excell Seltzer recommends you have a PFO CLOSURE. Instructions are below.  Follow-Up: You have your follow-up appointment scheduled August 29, 2018 at 3:30PM.  Any Other Special Instructions Will Be Listed Below (If Applicable).  PFO CLOSURE INSTRUCTIONS: You are scheduled for a PFO CLOSURE on Wednesday, February 5 with Dr. Tonny Bollman.  1. Please arrive at the [facility] (Main Entrance A) at [facility]: [address] [place], [place] [address] at 8:30 AM (This time is two hours before your procedure to ensure your preparation). Free valet parking service is available.   Special note: Every effort is made to have your procedure done on time. Please understand that emergencies sometimes delay scheduled procedures.  2. Diet: Do not eat solid foods after midnight.  The patient may have clear  liquids until 5am upon the day of the procedure. Make sure to stay well hydrated the day before your procedure!  3. Labs: None needed.  4. Medication instructions in preparation for your procedure:  1) Make sure to take your aspirin and Plavix the morning of your procedure.  2) you may take your other medications as  directed with sips of water.  5. Plan for one night stay--bring personal belongings. 6. Bring a current list of your medications and current insurance cards. 7. You MUST have a responsible person to drive you home. 8. Someone MUST be with you the first 24 hours after you arrive home or your discharge will be delayed. 9. Please wear clothes that are easy to get on and off and wear slip-on shoes.  Thank you for allowing [place] to care for you!   -- [facility]     Signed, Tonny Bollman, MD  07/18/2018 1:47 PM    [facility]

## 2018-07-16 NOTE — Patient Instructions (Addendum)
Medication Instructions:  1) take your last dose of ELIQUIS this Friday, January 31 2) On Saturday, Feb 1, START ASPIRIN 81 mg daily 3) On Saturday, Feb 1, START PLAVIX 75 mg daily  Labwork: None needed  Testing/Procedures: Dr. Excell Seltzer recommends you have a PFO CLOSURE. Instructions are below.  Follow-Up: You have your follow-up appointment scheduled August 29, 2018 at 3:30PM.  Any Other Special Instructions Will Be Listed Below (If Applicable).  PFO CLOSURE INSTRUCTIONS: You are scheduled for a PFO CLOSURE on Wednesday, February 5 with Dr. Tonny Bollman.  1. Please arrive at the [facility] (Main Entrance A) at [facility]: [address] [place], [place] [address] at 8:30 AM (This time is two hours before your procedure to ensure your preparation). Free valet parking service is available.   Special note: Every effort is made to have your procedure done on time. Please understand that emergencies sometimes delay scheduled procedures.  2. Diet: Do not eat solid foods after midnight.  The patient may have clear liquids until 5am upon the day of the procedure. Make sure to stay well hydrated the day before your procedure!  3. Labs: None needed.  4. Medication instructions in preparation for your procedure:  1) Make sure to take your aspirin and Plavix the morning of your procedure.  2) you may take your other medications as directed with sips of water.  5. Plan for one night stay--bring personal belongings. 6. Bring a current list of your medications and current insurance cards. 7. You MUST have a responsible person to drive you home. 8. Someone MUST be with you the first 24 hours after you arrive home or your discharge will be delayed. 9. Please wear clothes that are easy to get on and off and wear slip-on shoes.  Thank you for allowing [place] to care for you!   -- [facility]

## 2018-07-18 ENCOUNTER — Encounter: Payer: Self-pay | Admitting: Cardiovascular Disease

## 2018-07-23 ENCOUNTER — Telehealth: Payer: Self-pay | Admitting: *Deleted

## 2018-07-23 NOTE — Telephone Encounter (Signed)
Pt contacted pre-PFO closure scheduled at [facility] for: Wednesday July 25, 2018 10:30 AM Verified arrival time and place: [facility] Main Entrance A at: 8 AM  No solid food after midnight prior to cath, clear liquids until 5 AM day of procedure.  Pt confirmed her last dose of Eliquis/apixaban was 07/20/18 PM dose.   AM meds can be  taken pre-cath with sip of water including: ASA 81 mg Plavix 75 mg   Confirmed patient has responsible person to drive home post procedure and observe 24 hours after arriving home: yes

## 2018-07-25 ENCOUNTER — Encounter (HOSPITAL_COMMUNITY)
Admission: RE | Disposition: A | Discharge status: Home / Self Care | Payer: Self-pay | Source: Home / Self Care | Attending: Cardiovascular Disease

## 2018-07-25 ENCOUNTER — Ambulatory Visit (HOSPITAL_BASED_OUTPATIENT_CLINIC_OR_DEPARTMENT_OTHER): Payer: BC Managed Care – PPO

## 2018-07-25 ENCOUNTER — Ambulatory Visit (HOSPITAL_COMMUNITY)
Admission: RE | Admit: 2018-07-25 | Discharge: 2018-07-25 | Disposition: A | Discharge status: Home / Self Care | Payer: BC Managed Care – PPO | Attending: Cardiovascular Disease | Admitting: Cardiovascular Disease

## 2018-07-25 ENCOUNTER — Other Ambulatory Visit: Payer: Self-pay

## 2018-07-25 DIAGNOSIS — Z87891 Personal history of nicotine dependence: Secondary | ICD-10-CM | POA: Insufficient documentation

## 2018-07-25 DIAGNOSIS — Q2112 Patent foramen ovale: Secondary | ICD-10-CM

## 2018-07-25 DIAGNOSIS — Q211 Atrial septal defect: Secondary | ICD-10-CM | POA: Insufficient documentation

## 2018-07-25 DIAGNOSIS — Z823 Family history of stroke: Secondary | ICD-10-CM | POA: Diagnosis not present

## 2018-07-25 DIAGNOSIS — Z880 Allergy status to penicillin: Secondary | ICD-10-CM | POA: Insufficient documentation

## 2018-07-25 DIAGNOSIS — Z8673 Personal history of transient ischemic attack (TIA), and cerebral infarction without residual deficits: Secondary | ICD-10-CM | POA: Diagnosis not present

## 2018-07-25 DIAGNOSIS — Z8249 Family history of ischemic heart disease and other diseases of the circulatory system: Secondary | ICD-10-CM | POA: Diagnosis not present

## 2018-07-25 DIAGNOSIS — Z79899 Other long term (current) drug therapy: Secondary | ICD-10-CM | POA: Insufficient documentation

## 2018-07-25 DIAGNOSIS — Z882 Allergy status to sulfonamides status: Secondary | ICD-10-CM | POA: Insufficient documentation

## 2018-07-25 HISTORY — PX: PATENT FORAMEN OVALE(PFO) CLOSURE: CATH118300

## 2018-07-25 LAB — POCT ACTIVATED CLOTTING TIME
Activated Clotting Time: 241 seconds
Activated Clotting Time: 180 seconds
Activated Clotting Time: 224 seconds

## 2018-07-25 LAB — PREGNANCY, URINE: Preg Test, Ur: NEGATIVE

## 2018-07-25 LAB — ECHOCARDIOGRAM LIMITED
Height: 62 in
WEIGHTICAEL: 2289.26 [oz_av]

## 2018-07-25 SURGERY — PATENT FORAMEN OVALE (PFO) CLOSURE
Anesthesia: LOCAL

## 2018-07-25 MED ORDER — HEPARIN (PORCINE) IN NACL 1000-0.9 UT/500ML-% IV SOLN
INTRAVENOUS | Status: DC | PRN
  Administered 2018-07-25 (×3): 500 mL

## 2018-07-25 MED ORDER — VANCOMYCIN HCL IN DEXTROSE 1-5 GM/200ML-% IV SOLN
1000.0000 mg | INTRAVENOUS | Status: AC
  Administered 2018-07-25: 1000 mg via INTRAVENOUS

## 2018-07-25 MED ORDER — LIDOCAINE HCL (PF) 1 % IJ SOLN
INTRAMUSCULAR | Status: DC | PRN
  Administered 2018-07-25: 20 mL

## 2018-07-25 MED ORDER — SODIUM CHLORIDE 0.9% FLUSH: 3.0000 mL | Freq: Two times a day (BID) | INTRAVENOUS | Status: DC

## 2018-07-25 MED ORDER — SODIUM CHLORIDE 0.9 % IV SOLN: INTRAVENOUS | Status: AC

## 2018-07-25 MED ORDER — MIDAZOLAM HCL 2 MG/2ML IJ SOLN
INTRAMUSCULAR | Status: AC
  Filled 2018-07-25: qty 2

## 2018-07-25 MED ORDER — LIDOCAINE HCL (PF) 1 % IJ SOLN
INTRAMUSCULAR | Status: AC
  Filled 2018-07-25: qty 30

## 2018-07-25 MED ORDER — FENTANYL CITRATE (PF) 100 MCG/2ML IJ SOLN
INTRAMUSCULAR | Status: DC | PRN
  Administered 2018-07-25 (×3): 25 ug via INTRAVENOUS

## 2018-07-25 MED ORDER — SODIUM CHLORIDE 0.9% FLUSH: 3.0000 mL | INTRAVENOUS | Status: DC | PRN

## 2018-07-25 MED ORDER — ASPIRIN 81 MG PO CHEW: 81.0000 mg | CHEWABLE_TABLET | ORAL | Status: DC

## 2018-07-25 MED ORDER — VANCOMYCIN HCL IN DEXTROSE 1-5 GM/200ML-% IV SOLN
INTRAVENOUS | Status: AC
  Filled 2018-07-25: qty 200

## 2018-07-25 MED ORDER — HEPARIN SODIUM (PORCINE) 1000 UNIT/ML IJ SOLN
INTRAMUSCULAR | Status: DC | PRN
  Administered 2018-07-25: 5000 [IU] via INTRAVENOUS

## 2018-07-25 MED ORDER — SODIUM CHLORIDE 0.9 % WEIGHT BASED INFUSION
3.0000 mL/kg/h | INTRAVENOUS | Status: AC
  Administered 2018-07-25: 3 mL/kg/h via INTRAVENOUS

## 2018-07-25 MED ORDER — ACETAMINOPHEN 325 MG PO TABS: 650.0000 mg | ORAL_TABLET | ORAL | Status: DC | PRN

## 2018-07-25 MED ORDER — SODIUM CHLORIDE 0.9 % IV SOLN: 250.0000 mL | INTRAVENOUS | Status: DC | PRN

## 2018-07-25 MED ORDER — ONDANSETRON HCL 4 MG/2ML IJ SOLN: 4.0000 mg | Freq: Four times a day (QID) | INTRAMUSCULAR | Status: DC | PRN

## 2018-07-25 MED ORDER — MIDAZOLAM HCL 2 MG/2ML IJ SOLN
INTRAMUSCULAR | Status: DC | PRN
  Administered 2018-07-25: 2 mg via INTRAVENOUS
  Administered 2018-07-25 (×2): 1 mg via INTRAVENOUS

## 2018-07-25 MED ORDER — HEPARIN SODIUM (PORCINE) 1000 UNIT/ML IJ SOLN
INTRAMUSCULAR | Status: AC
  Filled 2018-07-25: qty 1

## 2018-07-25 MED ORDER — CLOPIDOGREL BISULFATE 75 MG PO TABS: 75.0000 mg | ORAL_TABLET | ORAL | Status: DC

## 2018-07-25 MED ORDER — SODIUM CHLORIDE 0.9 % WEIGHT BASED INFUSION: 1.0000 mL/kg/h | INTRAVENOUS | Status: DC

## 2018-07-25 MED ORDER — HEPARIN (PORCINE) IN NACL 1000-0.9 UT/500ML-% IV SOLN
INTRAVENOUS | Status: AC
  Filled 2018-07-25: qty 1000

## 2018-07-25 MED ORDER — FENTANYL CITRATE (PF) 100 MCG/2ML IJ SOLN
INTRAMUSCULAR | Status: AC
  Filled 2018-07-25: qty 2

## 2018-07-25 MED ORDER — SODIUM CHLORIDE 0.9 % IV SOLN
INTRAVENOUS | Status: AC | PRN
  Administered 2018-07-25: 10 mL/h via INTRAVENOUS

## 2018-07-25 SURGICAL SUPPLY — 14 items
CATH ACUNAV 8FR 90CM (CATHETERS) ×2 IMPLANT
CATH SUPER TORQUE PLUS 6F MPA1 (CATHETERS) ×2 IMPLANT
COVER SWIFTLINK CONNECTOR (BAG) ×2 IMPLANT
KIT PV (KITS) ×2 IMPLANT
OCCLUDER AMPLATZER PFO 25MM (Prosthesis & Implant Heart) ×2 IMPLANT
PACK CARDIAC CATHETERIZATION (CUSTOM PROCEDURE TRAY) ×2 IMPLANT
SHEATH INTROD W/O MIN 9FR 25CM (SHEATH) ×2 IMPLANT
SHEATH PINNACLE 8F 10CM (SHEATH) ×2 IMPLANT
SHEATH PROBE COVER 6X72 (BAG) ×2 IMPLANT
SYS DELIVER AMP TREVISIO 8FR (SHEATH) ×2
SYSTEM DELIVER AMP TREVIS 8FR (SHEATH) ×1 IMPLANT
WIRE AQUATRAK .035X260 ANG (WIRE) ×2 IMPLANT
WIRE EMERALD 3MM-J .035X150CM (WIRE) ×2 IMPLANT
WIRE G VAS 035X260 STIFF (WIRE) ×2 IMPLANT

## 2018-07-25 NOTE — Progress Notes (Addendum)
  Site area: Right groin a 8 and 9 french venous sheath was removed  Site Prior to Removal:  Level 0  Pressure Applied For 20 MINUTES    Bedrest Beginning at 1310pm  Manual:   Yes.    Patient Status During Pull:  stable  Post Pull Groin Site:  Level 0  Post Pull Instructions Given:  Yes.    Post Pull Pulses Present:  Yes.    Dressing Applied:  Yes.    Comments:  VS remain stable

## 2018-07-25 NOTE — Interval H&P Note (Signed)
History and Physical Interval Note:  07/25/2018 7:52 AM  Krystal Oneal  has presented today for surgery, with the diagnosis of pfo  The various methods of treatment have been discussed with the patient and family. After consideration of risks, benefits and other options for treatment, the patient has consented to  Procedure(s): PATENT FORAMEN OVALE (PFO) CLOSURE (N/A) as a surgical intervention .  The patient's history has been reviewed, patient examined, no change in status, stable for surgery.  I have reviewed the patient's chart and labs.  Questions were answered to the patient's satisfaction.     [name]

## 2018-07-25 NOTE — Progress Notes (Signed)
  Echocardiogram 2D Echocardiogram has been performed.  Delcie Roch 07/25/2018, 3:51 PM

## 2018-07-25 NOTE — Discharge Instructions (Signed)
Angiogram, Care After °This sheet gives you information about how to care for yourself after your procedure. Your doctor may also give you more specific instructions. If you have problems or questions, contact your doctor. °Follow these instructions at home: °Insertion site care °· Follow instructions from your doctor about how to take care of your long, thin tube (catheter) insertion area. Make sure you: °? Wash your hands with soap and water before you change your bandage (dressing). If you cannot use soap and water, use hand sanitizer. °? Change your bandage as told by your doctor. °? Leave stitches (sutures), skin glue, or skin tape (adhesive) strips in place. They may need to stay in place for 2 weeks or longer. If tape strips get loose and curl up, you may trim the loose edges. Do not remove tape strips completely unless your doctor says it is okay. °· Do not take baths, swim, or use a hot tub until your doctor says it is okay. °· You may shower 24-48 hours after the procedure or as told by your doctor. °? Gently wash the area with plain soap and water. °? Pat the area dry with a clean towel. °? Do not rub the area. This may cause bleeding. °· Do not apply powder or lotion to the area. Keep the area clean and dry. °· Check your insertion area every day for signs of infection. Check for: °? More redness, swelling, or pain. °? Fluid or blood. °? Warmth. °? Pus or a bad smell. °Activity °· Rest as told by your doctor, usually for 1-2 days. °· Do not lift anything that is heavier than 10 lbs. (4.5 kg) or as told by your doctor. °· Do not drive for 24 hours if you were given a medicine to help you relax (sedative). °· Do not drive or use heavy machinery while taking prescription pain medicine. °General instructions ° °· Go back to your normal activities as told by your doctor, usually in about a week. Ask your doctor what activities are safe for you. °· If the insertion area starts to bleed, lie flat and put  pressure on the area. If the bleeding does not stop, get help right away. This is an emergency. °· Drink enough fluid to keep your pee (urine) clear or pale yellow. °· Take over-the-counter and prescription medicines only as told by your doctor. °· Keep all follow-up visits as told by your doctor. This is important. °Contact a doctor if: °· You have a fever. °· You have chills. °· You have more redness, swelling, or pain around your insertion area. °· You have fluid or blood coming from your insertion area. °· The insertion area feels warm to the touch. °· You have pus or a bad smell coming from your insertion area. °· You have more bruising around the insertion area. °· Blood collects in the tissue around the insertion area (hematoma) that may be painful to the touch. °Get help right away if: °· You have a lot of pain in the insertion area. °· The insertion area swells very fast. °· The insertion area is bleeding, and the bleeding does not stop after holding steady pressure on the area. °· The area near or just beyond the insertion area becomes pale, cool, tingly, or numb. °These symptoms may be an emergency. Do not wait to see if the symptoms will go away. Get medical help right away. Call your local emergency services (911 in the [place] Do not drive yourself to the hospital. °  Summary  After the procedure, it is common to have bruising and tenderness at the long, thin tube insertion area.  After the procedure, it is important to rest and drink plenty of fluids.  Do not take baths, swim, or use a hot tub until your doctor says it is okay to do so. You may shower 24-48 hours after the procedure or as told by your doctor.  If the insertion area starts to bleed, lie flat and put pressure on the area. If the bleeding does not stop, get help right away. This is an emergency. This information is not intended to replace advice given to you by your health care provider. Make sure you discuss any questions you have  with your health care provider. Document Released: 09/02/2008 Document Revised: 05/31/2016 Document Reviewed: 05/31/2016 Elsevier Interactive Patient Education  2019 [employer].   Patent Foramen Ovale Closure, Adult, Care After  This sheet gives you information about how to care for yourself after your procedure. Your health care provider may also give you more specific instructions. If you have problems or questions, contact your health care provider. What can I expect after the procedure? After the procedure, it is common to have:  Bruising.  Tenderness in the area where the long, thin tube was inserted into your inner thigh or groin area (catheter insertion site). Follow these instructions at home: Catheter insertion site care   Follow instructions from your health care provider about how to take care of your incision or puncture. Make sure you: ? Wash your hands with soap and water before you change your bandage (dressing). If soap and water are not available, use hand sanitizer. ? Change your dressing as told by your health care provider. ? Leave stitches (sutures), skin glue, or adhesive strips in place. These skin closures may need to stay in place for 2 weeks or longer. If adhesive strip edges start to loosen and curl up, you may trim the loose edges. Do not remove adhesive strips completely unless your health care provider tells you to do that.  Check your catheter insertion site every day for signs of infection. Check for: ? Redness, swelling, or pain. ? Fluid or blood. ? Warmth. ? Pus or a bad smell. ? A lump or bump. Activity  Do not lift anything that is heavier than 10 lb (4.5 kg), or the limit that your health care provider tells you, until he or she says that it is safe.  Return to your normal activities as told by your health care provider. Ask your health care provider what activities are safe for you.  Do not drive until your health care provider  approves. Medicines  Take over-the-counter and prescription medicines only as told by your health care provider.  You may need to take medicines to prevent blood clots for 6 months or longer. You may have to take aspirin and clopidogrel. Clopidogrel is a blood thinner (anticoagulant) that helps to prevent heart attacks and strokes. Lifestyle  Avoid drinking alcohol.  Do not use any products that contain nicotine or tobacco, such as cigarettes and e-cigarettes. If you need help quitting, ask your health care provider. General instructions  Drink enough fluid to keep your urine clear or pale yellow. This helps to get rid of the dye that was used during the procedure.  Tell all health care providers and dental care providers who care for you that you had a patent foramen ovale closure. Do this before having any type of test  or surgery.  Ask your health care provider if you need to take antibiotic medicine before dental procedures and surgeries. This may be necessary to prevent infection.  While taking anticoagulants: ? Prevent falls by removing loose rugs and extension cords from areas where you walk. ? Be very careful when using knives, scissors, or other sharp objects. ? Do not play contact sports or participate in other activities that have a high risk of injury.  Do not take baths, swim, or use a hot tub until your health care provider approves.  Keep all follow-up visits as told by your health care provider. This is important. Contact a health care provider if:  You have a fever.  You have pain that does not get better with medicine.  You have fluid or blood coming from your catheter insertion site.  You have a hard lump or bump at your catheter insertion site. Get help right away if:  You have trouble breathing.  You have chest pain.  You have redness, swelling, or pain around your catheter insertion site.  Your catheter insertion site feels warm to the touch.  You  have pus or a bad smell coming from your catheter insertion site.  You have severe pain in your arm or jaw. Summary  After the procedure, it is common for you to have some bruising and tenderness where a tube was inserted into your inner thigh or groin area (catheter insertion site).  Check your catheter insertion site every day for signs of infection, such as redness, swelling, or pain.  Before any procedure or test, tell all health care providers and dental providers who care for you that you had a patent foramen ovale closure. This information is not intended to replace advice given to you by your health care provider. Make sure you discuss any questions you have with your health care provider. Document Released: 07/07/2016 Document Revised: 07/07/2016 Document Reviewed: 07/07/2016 Elsevier Interactive Patient Education  2019 [employer].

## 2018-07-26 ENCOUNTER — Encounter (HOSPITAL_COMMUNITY): Payer: Self-pay | Admitting: Cardiovascular Disease

## 2018-08-08 ENCOUNTER — Encounter: Payer: Self-pay | Admitting: Physician Assistant

## 2018-08-16 ENCOUNTER — Ambulatory Visit: Payer: BC Managed Care – PPO | Admitting: Adult Health

## 2018-08-16 ENCOUNTER — Other Ambulatory Visit: Payer: Self-pay

## 2018-08-16 ENCOUNTER — Encounter: Payer: Self-pay | Admitting: Adult Health

## 2018-08-16 ENCOUNTER — Telehealth: Payer: Self-pay | Admitting: Adult Health

## 2018-08-16 VITALS — BP 126/78 | HR 101 | Resp 16 | Ht 62.0 in | Wt 147.0 lb

## 2018-08-16 DIAGNOSIS — Q211 Atrial septal defect: Secondary | ICD-10-CM

## 2018-08-16 DIAGNOSIS — R42 Dizziness and giddiness: Secondary | ICD-10-CM

## 2018-08-16 DIAGNOSIS — I63411 Cerebral infarction due to embolism of right middle cerebral artery: Secondary | ICD-10-CM

## 2018-08-16 DIAGNOSIS — E785 Hyperlipidemia, unspecified: Secondary | ICD-10-CM

## 2018-08-16 DIAGNOSIS — Q2112 Patent foramen ovale: Secondary | ICD-10-CM

## 2018-08-16 NOTE — Telephone Encounter (Signed)
Patient states she has a few questions about her taking plavex that she forgot to ask during her visit. She has asked to be called to discuss briefly.

## 2018-08-16 NOTE — Progress Notes (Signed)
I agree with the above plan 

## 2018-08-16 NOTE — Patient Instructions (Addendum)
Continue aspirin 81 mg daily and clopidogrel 75 mg daily  and lipitor  for secondary stroke prevention  Continue to follow up with Dr. Excell Seltzer regarding PFO closure follow up   Continue to follow up with PCP regarding cholesterol and blood pressure management   Increase fluid intake to at least 8 bottles of water per day to help with dizziness  Referral placed to physical therapy to assist with dizziness  FMLA paperwork filled out   Continue to monitor blood pressure at home  Maintain strict control of hypertension with blood pressure goal below 130/90, diabetes with hemoglobin A1c goal below 6.5% and cholesterol with LDL cholesterol (bad cholesterol) goal below 70 mg/dL. I also advised the patient to eat a healthy diet with plenty of whole grains, cereals, fruits and vegetables, exercise regularly and maintain ideal body weight.  Followup in the future with me in 3 months or call earlier if needed       Thank you for coming to see [place] at [facility]. I hope we have been able to provide you high quality care today.  You may receive a patient satisfaction survey over the next few weeks. We would appreciate your feedback and comments so that we may continue to improve ourselves and the health of our patients.

## 2018-08-16 NOTE — Progress Notes (Signed)
[facility] [address] [place]. [place] [address] [phone]       OFFICE FOLLOW UP NOTE  [name]:  1971-03-16 Medical Record Number:  [id number]   Reason for Referral:  hospital stroke follow up  CHIEF COMPLAINT:  Chief Complaint  Patient presents with  . Cerebrovascular Accident    Treatment room. Here with husband Jillyn Hidden for f/u of CVA on 07/04/18. Seen in the ER after 45 min. episode of left facial, arm numbness, aphasia. Testing showed PFO, which she had closed 07/25/18. Sts. still has intermittent dizziness/fim  . PFO  . 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 History obtained from patient, husband and chart review. Reviewed all radiology images and labs personally.  [name] is a 48 y.o. female with history of anxiety who presented with word finding difficulty and weakness of the left arm.  CT/CTA head and neck showed right M3/M4 branch occlusion in the right parietal lobe.  MRI brain reviewed and showed acute infarct posterior right MCA territory involving the posterior insula and right parietal lobe in the region of the motor cortex.  TTE showed an EF of 55 to 60%.  TEE showed evidence of large PFO and ASA.  Lower extremity venous Dopplers negative for DVT.  Due to evidence of large PFO and ASA, recommended initiating Eliquis 5 mg twice daily until outpatient PFO closure with Dr. Excell Seltzer.  LDL 79 and initiated atorvastatin 20 mg daily.  A1c 5.3.  Other stroke risk factors include family history of stroke (mother).  He was discharged home in stable condition without therapy needs.  She is being seen today for follow-up visit and is accompanied by her husband.  She did undergo PFO closure on 07/25/2018 which was tolerated well without any reported complications.  She  continues to have residual deficits of intermittent dizziness which she will experience daily and will typically start midmorning and will come and go throughout the day.  She has not associated dizziness sensation with any type of position change or head movement.  This sensation can occur while sitting but also while ambulating.  She has been experiencing the sensation since hospital discharge.  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  She was previously receiving FMLA through her PCP for 6 weeks post stroke therefore will end tomorrow.  She is fearful to return at this time due to ongoing dizziness that occurs randomly.  She does not typically monitor BP at home as she states she has never had any type of blood pressure issue.  She typically drinks 2 cups of coffee in the morning, a small bottle of Sprite in the afternoon and potentially 2 bottles of water throughout the day.  She is able to ambulate without use of assistive device but when dizziness sensation occurs, she will have to grab on 10 years objects to stabilize herself.  She has not participated in any type of therapies.  She continues on aspirin and Plavix which was recommended post PFO closure without reported bleeding or bruising.  She continues on atorvastatin 20 mg daily without side effects myalgias.  Blood pressure today satisfactory 126/78.  Denies new  or worsening stroke/TIA symptoms.     ROS:   14 system review of systems performed and negative with exception of fatigue and dizziness  PMH:  Past Medical History:  Diagnosis Date  . Anxiety   . CVA (cerebral vascular accident) (HCC) 07/04/2018  . PFO (patent foramen ovale)    closed 07/25/18  . Vision abnormalities     PSH:  Past Surgical History:  Procedure Laterality Date  . HYDRADENITIS EXCISION    . PATENT FORAMEN  OVALE(PFO) CLOSURE N/A 07/25/2018   Procedure: PATENT FORAMEN OVALE (PFO) CLOSURE;  Surgeon: Tonny Bollman, MD;  Location[facility] INVASIVE CV LAB;  Service: Cardiovascular;  Laterality: N/A;  . TEE WITHOUT CARDIOVERSION N/A 07/06/2018   Procedure: TRANSESOPHAGEAL ECHOCARDIOGRAM (TEE);  Surgeon: Elease Hashimoto Deloris Ping, MD;  Location: [facility] ENDOSCOPY;  Service: Cardiovascular;  Laterality: N/A;    Social History:  Social History   Socioeconomic History  . Marital status: Married    Spouse name: Krystal Oneal  . Number of children: 2  . Years of education: Not on file  . Highest education level: High school graduate  Occupational History  . Occupation: Geologist, engineering    Comment: TEFL teacher  Social Needs  . Financial resource strain: Not on file  . Food insecurity:    Worry: Not on file    Inability: Not on file  . Transportation needs:    Medical: Not on file    Non-medical: Not on file  Tobacco Use  . Smoking status: Former Smoker    Packs/day: 1.00    Years: 30.00    Pack years: 30.00    Types: Cigarettes    Last attempt to quit: 2017    Years since quitting: 3.1  . Smokeless tobacco: Never Used  Substance and Sexual Activity  . Alcohol use: Never    Frequency: Never  . Drug use: Never  . Sexual activity: Not on file  Lifestyle  . Physical activity:    Days per week: Not on file    Minutes per session: Not on file  . Stress: Not on file  Relationships  . Social connections:    Talks on phone: Not on file    Gets together: Not on file    Attends religious service: Not on file    Active member of club or organization: Not on file    Attends meetings of clubs or organizations: Not on file    Relationship status: Not on file  . Intimate partner violence:    Fear of current or ex partner: Not on file    Emotionally abused: Not on file    Physically abused: Not on file    Forced sexual activity: Not on file  Other Topics Concern  . Not on file  Social History  Narrative  . Not on file    Family History:  Family History  Problem Relation Age of Onset  . Hypertension Mother   . CVA Mother 68  . Chronic Renal Failure Mother   . Hypertension Father   . Other Maternal Grandmother        died during childbirth  . Leukemia Maternal Grandfather   . Heart disease Paternal Grandmother   . Heart disease Paternal Grandfather   . Hypertension Brother   . Breast cancer Maternal Aunt   . Stomach cancer Maternal Aunt   . Lung cancer Half-Sister     Medications:   Current Outpatient Medications on File Prior to Visit  Medication Sig Dispense Refill  .  aspirin EC 81 MG tablet Take 1 tablet (81 mg total) by mouth daily. 90 tablet 3  . atorvastatin (LIPITOR) 20 MG tablet Take 1 tablet (20 mg total) by mouth daily at 6 PM. 30 tablet 0  . Calcium Carb-Cholecalciferol (CALCIUM 600 + D PO) Take 1 tablet by mouth daily.    . clonazePAM (KLONOPIN) 0.5 MG tablet Take 0.5 mg by mouth at bedtime.     . clopidogrel (PLAVIX) 75 MG tablet Take 1 tablet (75 mg total) by mouth daily. 90 tablet 3  . loratadine (CLARITIN) 10 MG tablet Take 10 mg by mouth daily.    . Multiple Vitamin (MULTIVITAMIN WITH MINERALS) TABS tablet Take 1 tablet by mouth daily.    . nicotine polacrilex (NICORETTE) 4 MG gum Take 4 mg by mouth as needed for smoking cessation.    . Probiotic Product (PROBIOTIC DAILY PO) Take 1 tablet by mouth daily.    . vitamin B-12 1000 MCG tablet Take 1 tablet (1,000 mcg total) by mouth daily. 30 tablet 0   No current facility-administered medications on file prior to visit.     Allergies:   Allergies  Allergen Reactions  . Sulfa Antibiotics Itching  . Amoxicillin Hives and Rash  . Penicillins Hives and Rash    Did it involve swelling of the face/tongue/throat, SOB, or low BP? Yes Did it involve sudden or severe rash/hives, skin peeling, or any reaction on the inside of your mouth or nose? Yes Did you need to seek medical attention at a hospital or  doctor's office? No When did it last happen?2 years ago If all above answers are "NO", may proceed with cephalosporin use.      Physical Exam  Vitals:   08/16/18 1341  BP: 126/78  Pulse: (!) 101  Resp: 16  Weight: 147 lb (66.7 kg)  Height:  (1.575 m)   Body mass index is 26.89 kg/m. No exam data present  Depression screen [facility] 2/9 08/16/2018  Decreased Interest 0  Down, Depressed, Hopeless 0  PHQ - 2 Score 0     General: well developed, well nourished, pleasant middle-aged Caucasian female, seated, in no evident distress Head: head normocephalic and atraumatic.   Neck: supple with no carotid or supraclavicular bruits Cardiovascular: regular rate and rhythm, no murmurs Musculoskeletal: no deformity Skin:  no rash/petichiae Vascular:  Normal pulses all extremities  Neurologic Exam Mental Status: Awake and fully alert. Oriented to place and time. Recent and remote memory intact. Attention span, concentration and fund of knowledge appropriate. Mood and affect appropriate.  Cranial Nerves: Fundoscopic exam reveals sharp disc margins. Pupils equal, briskly reactive to light. Extraocular movements full without nystagmus. Visual fields full to confrontation. Hearing intact. Facial sensation intact. Face, tongue, palate moves normally and symmetrically.  Motor: Normal bulk and tone. Normal strength in all tested extremity muscles. Sensory.: intact to touch , pinprick , position and vibratory sensation.  Coordination: Rapid alternating movements normal in all extremities. Finger-to-nose and heel-to-shin performed accurately bilaterally. Gait and Station: Arises from chair without difficulty. Stance is normal. Gait demonstrates slow gait. Able to heel, toe and tandem walk with mild difficulty.  Romberg negative Reflexes: 1+ and symmetric. Toes downgoing.    NIHSS  0 Modified Rankin  2    Diagnostic Data (Labs, Imaging, Testing)  CT HEAD WO CONTRAST CT ANGIO HEAD W  OR WO CONTRAST CT ANGIO NECK W OR WO CONTRAST 07/04/2018 IMPRESSION: 1. Right M3/M4 branch occlusion in the right parietal lobe corresponding to  acute infarct on MRI. This is presumably an embolus from the heart. No other significant intracranial or extracranial disease.  MR BRAIN WO CONTRAST 07/04/2018 IMPRESSION: Acute infarct posterior right MCA territory involving the posterior insula and right parietal lobe in the region of the motor cortex. Negative FLAIR imaging. Probable clot in distal right MCA branch versus focal microhemorrhage. Increased flow in this vessel on FLAIR suggests slow flow.  ECHO TEE 07/06/2018 Study Conclusions - Left ventricle: Systolic function was normal. The estimated   ejection fraction was in the range of 60% to 65%. Wall motion was   normal; there were no regional wall motion abnormalities. - Aortic valve: No evidence of vegetation. - Mitral valve: No evidence of vegetation. - Left atrium: No evidence of thrombus in the atrial cavity or   appendage. - Atrial septum: There was a large patent foramen ovale. - Tricuspid valve: No evidence of vegetation.    ASSESSMENT: Krystal Oneal is a 48 y.o. year old female here with right posterior MCA infarct embolic pattern with right M3/M4 branch occlusion on 07/04/2018 secondary to large PFO and ASA. Vascular risk factors include HLD, PFO and ASA.  She did undergo PFO closure on 07/25/2018 without complication.  She is being seen today for hospital follow-up with continued dizziness sensation.    PLAN:  1. Right posterior MCA infarct: Continue aspirin 81 mg daily and clopidogrel 75 mg daily  and atorvastatin for secondary stroke prevention. Maintain strict control of hypertension with blood pressure goal below 130/90, diabetes with hemoglobin A1c goal below 6.5% and cholesterol with LDL cholesterol (bad cholesterol) goal below 70 mg/dL.  I also advised the patient to eat a healthy diet with plenty of whole grains,  cereals, fruits and vegetables, exercise regularly with at least 30 minutes of continuous activity daily and maintain ideal body weight. 2. Dizziness: Referral placed for outpatient physical therapy.  It was also advised to increase fluid intake as part of these episodes could be due to decreased intake.  Also encouraged increasing activity level as tolerated as she does states she has not been active since she has been discharged.  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  Recommended to stay out of work for an additional 4 weeks or return earlier if able.  FMLA form will be provided 3. S/p PFO: Continue DAPT along with continued follow-up with Dr. Excell Seltzer for decision on DAPT duration and routine follow-up/monitoring 4. HLD: Advised to continue current treatment regimen along with continued follow-up with PCP for future prescribing and monitoring of lipid panel 5. Recommended monitoring BP at home to ensure dizziness episodes are not related to hypotension   Follow up in 3 months or call earlier if needed   Greater than 50% of time during this 25 minute visit was spent on counseling, explanation of diagnosis of right MCA infarct, reviewing risk factor management of HLD, planning of further management along with potential future management, and discussion with patient and family answering all questions.    George Hugh, AGNP-BC  [facility] [address] [place], [place] [address]  Phone [phone] Fax [phone] Note: This document was prepared with digital dictation and possible smart phrase technology. Any transcriptional errors that result from this process are unintentional.

## 2018-08-16 NOTE — Telephone Encounter (Addendum)
I called patient about her plavix. I stated per Shanda Bumps NP note she is to continue the plavix, aspirin and lipitor for stroke prevention. I stated she can also ask Dr. Excell Seltzer about the plavix management from a cardiology standpoint. Pt verbalized understanding and will continue plavix.

## 2018-08-21 ENCOUNTER — Telehealth: Payer: Self-pay | Admitting: Adult Health

## 2018-08-21 DIAGNOSIS — Z0289 Encounter for other administrative examinations: Secondary | ICD-10-CM

## 2018-08-21 NOTE — Telephone Encounter (Signed)
Pt called stating that she has left several messages and has not received a call back. Pt is needing her FMLA paperwork as soon as possible and is wanting to make a form payment over there phone.  Please advise.

## 2018-08-21 NOTE — Telephone Encounter (Signed)
FMLA form done waiting for provider signature.

## 2018-08-22 NOTE — Telephone Encounter (Signed)
I called [employer] HR specialist at [phone]. She receive the FMLA form that was fax twice.

## 2018-08-22 NOTE — Telephone Encounter (Signed)
FMLA form done and fax twice to human resources at [phone]. Fax confirmed [phone].

## 2018-08-27 ENCOUNTER — Telehealth: Payer: Self-pay

## 2018-08-27 ENCOUNTER — Telehealth: Payer: Self-pay | Admitting: *Deleted

## 2018-08-27 NOTE — Telephone Encounter (Signed)
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 Please call [phone].

## 2018-08-27 NOTE — Telephone Encounter (Signed)
revise 

## 2018-08-27 NOTE — Telephone Encounter (Signed)
Revised. 

## 2018-08-27 NOTE — Telephone Encounter (Addendum)
I called pt back about needing a letter stating she is unable to drive because of ongoing dizzy spells. I stated we did FMLA form for her to be out for 4 weeks due to balance issues and dizzy spells.i stated we have her out till the end of the month. Pt stated she has a field trip on 09/14/2018 and need to let her job know. Pt stated Shanda Bumps NP told her to drink plenty of water and stay hydrated to see would if get better after a week. Pt stated its $40.00 dollars for therapy at every session and she has to see cardiology which is $94.00 dollars. Pt stated her dizzy spells is not all the time. I encourage her to call the therapy place in [place] for her dizzy spells and balance. She stated the therapy place called her to schedule an appt but she has not at this time. I explain for her to get better at least do one session to see what they recommend for exercises at home for dizzy spells. Pt stated " well that's not what I was told by Shanda Bumps NP." PT stated she was told to call back within a week and JEssica NP was aware of the co pay at the therapy place. I explained to pt Shanda Bumps NP will be in the office on Wednesday,and she can evaluate her chart.I also stated typically we look at therapy notes to see if pt is safe to drive or return to work. Pt will wait on Jessica NP recommendations and verbalized understanding.

## 2018-08-27 NOTE — Telephone Encounter (Signed)
Made in error

## 2018-08-28 DIAGNOSIS — Z8774 Personal history of (corrected) congenital malformations of heart and circulatory system: Secondary | ICD-10-CM | POA: Insufficient documentation

## 2018-08-28 NOTE — Progress Notes (Signed)
[facility]   MULTIDISCIPLINARY HEART VALVE CLINIC                                       Cardiology Office Note    Date:  08/29/2018   ID:  Krystal Oneal, DOB 06/06/1971, MRN [id number]  PCP:  Everlean Cherry, MD  Cardiologist: Dr. Excell Seltzer   CC: 1 month s/p PFO closure   History of Present Illness:  Krystal Oneal is a 48 y.o. female with a history of cryptogenic CVA, PFO with recent PFO closure (07/25/18) who presents to clinic for follow up.   She was admitted in 06/2018 for word finding difficulty and left arm weakness. A CTA of the head and neck demonstrated branch occlusion of the right M3 and M4 branches in the parietal lobe. MRI showed acute infarct in the posterior right MCA territory suspicious for cardioembolic event. During her hospitalization she underwent multiple imaging studies demonstrating no evidence of large vessel atherosclerotic disease. Her echocardiogram showed normal LV function, no significant valvular disease, and no right heart dysfunction. She underwent a TEE which demonstrated a large PFO with spontaneous right to left shunting. She was placed on oral anticoagulation with apixaban. She was seen by Dr. Excell Seltzer for consideration of PFO closure and underwent successful transcatheter PFO closure under fluoroscopic and intracardiac echo guidance. She was discharged on aspirin and plavix x3 months followed by aspirin 81mg  daily indefinitely.   Today she presents to clinic for follow up. No CP or SOB. No LE edema, orthopnea or PND. She has significant dizziness since her stroke but no syncope. Her dizziness comes on with no pattern and lasts up to 10 minutes. 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 No blood in stool or urine. No palpitations.    Past Medical History:  Diagnosis Date  . Anxiety   . CVA (cerebral vascular accident) (HCC) 07/04/2018  . PFO (patent foramen ovale)    closed 07/25/18  . Vision  abnormalities     Past Surgical History:  Procedure Laterality Date  . HYDRADENITIS EXCISION    . PATENT FORAMEN OVALE(PFO) CLOSURE N/A 07/25/2018   Procedure: PATENT FORAMEN OVALE (PFO) CLOSURE;  Surgeon: Tonny Bollman, MD;  Location: [facility] INVASIVE CV LAB;  Service: Cardiovascular;  Laterality: N/A;  . TEE WITHOUT CARDIOVERSION N/A 07/06/2018   Procedure: TRANSESOPHAGEAL ECHOCARDIOGRAM (TEE);  Surgeon: Elease Hashimoto Deloris Ping, MD;  Location: [facility] ENDOSCOPY;  Service: Cardiovascular;  Laterality: N/A;    Current Medications: Outpatient Medications Prior to Visit  Medication Sig Dispense Refill  . aspirin EC 81 MG tablet Take 1 tablet (81 mg total) by mouth daily. 90 tablet 3  . atorvastatin (LIPITOR) 20 MG tablet Take 1 tablet (20 mg total) by mouth daily at 6 PM. 30 tablet 0  . Calcium Carb-Cholecalciferol (CALCIUM 600 + D PO) Take 1 tablet by mouth daily.    . clonazePAM (KLONOPIN) 0.5 MG tablet Take 0.5 mg by mouth at bedtime.     . clopidogrel (PLAVIX) 75 MG tablet Take 1 tablet (75 mg total) by mouth daily. 90 tablet 3  . loratadine (CLARITIN) 10 MG tablet Take 10 mg by mouth daily.    . Multiple Vitamin (MULTIVITAMIN WITH MINERALS) TABS tablet Take 1 tablet by mouth daily.    . nicotine polacrilex (NICORETTE) 4 MG gum Take 4 mg by mouth as  needed for smoking cessation.    . Probiotic Product (PROBIOTIC DAILY PO) Take 1 tablet by mouth daily.    . vitamin B-12 1000 MCG tablet Take 1 tablet (1,000 mcg total) by mouth daily. 30 tablet 0   No facility-administered medications prior to visit.      Allergies:   Sulfa antibiotics; Amoxicillin; and Penicillins   Social History   Socioeconomic History  . Marital status: Married    Spouse name: Naylene Garmendia  . Number of children: 2  . Years of education: Not on file  . Highest education level: High school graduate  Occupational History  . Occupation: Geologist, engineering    Comment: TEFL teacher  Social Needs  . Financial  resource strain: Not on file  . Food insecurity:    Worry: Not on file    Inability: Not on file  . Transportation needs:    Medical: Not on file    Non-medical: Not on file  Tobacco Use  . Smoking status: Former Smoker    Packs/day: 1.00    Years: 30.00    Pack years: 30.00    Types: Cigarettes    Last attempt to quit: 2017    Years since quitting: 3.1  . Smokeless tobacco: Never Used  Substance and Sexual Activity  . Alcohol use: Never    Frequency: Never  . Drug use: Never  . Sexual activity: Not on file  Lifestyle  . Physical activity:    Days per week: Not on file    Minutes per session: Not on file  . Stress: Not on file  Relationships  . Social connections:    Talks on phone: Not on file    Gets together: Not on file    Attends religious service: Not on file    Active member of club or organization: Not on file    Attends meetings of clubs or organizations: Not on file    Relationship status: Not on file  Other Topics Concern  . Not on file  Social History Narrative  . Not on file     Family History:  The patient's family history includes Breast cancer in her maternal aunt; CVA (age of onset: 9) in her mother; Chronic Renal Failure in her mother; Heart disease in her paternal grandfather and paternal grandmother; Hypertension in her brother, father, and mother; Leukemia in her maternal grandfather; Lung cancer in her half-sister; Other in her maternal grandmother; Stomach cancer in her maternal aunt.      ROS:   Please see the history of present illness.    ROS All other systems reviewed and are negative.   PHYSICAL EXAM:   VS:  BP 122/60   Pulse (!) 101   Ht 5\' 2"  (1.575 m)   Wt 147 lb (66.7 kg)   SpO2 98%   BMI 26.89 kg/m    GEN: Well nourished, well developed, in no acute distress HEENT: normal Neck: no JVD or masses Cardiac: RRR; no murmurs, rubs, or gallops,no edema  Respiratory:  clear to auscultation bilaterally, normal work of breathing GI:  soft, nontender, nondistended, + BS MS: no deformity or atrophy Skin: warm and dry, no rash Neuro:  Alert and Oriented x 3, Strength and sensation are intact Psych: euthymic mood, full affect   Wt Readings from Last 3 Encounters:  08/29/18 147 lb (66.7 kg)  08/16/18 147 lb (66.7 kg)  07/25/18 143 lb 1.3 oz (64.9 kg)      Studies/Labs Reviewed:   EKG:  EKG is NOT ordered today.    Recent Labs: 07/04/2018: TSH 1.284 07/06/2018: ALT 14; BUN 5; Creatinine, Ser 0.68; Hemoglobin 11.1; Magnesium 1.9; Platelets 212; Potassium 4.2; Sodium 141   Lipid Panel    Component Value Date/Time   CHOL 134 07/05/2018 0616   TRIG 117 07/05/2018 0616   HDL 32 (L) 07/05/2018 0616   CHOLHDL 4.2 07/05/2018 0616   VLDL 23 07/05/2018 0616   LDLCALC 79 07/05/2018 0616    Additional studies/ records that were reviewed today include:   07/25/18 PATENT FORAMEN OVALE (PFO) CLOSURE  Conclusion  Successful transcatheter PFO closure under fluoroscopic and intracardiac echo guidance  Recommend: limited echo prior to discharge today. ASA/clopidogrel x 3 months, then continue ASA 81 mg daily.    ______________  Echo 07/25/2018 IMPRESSIONS  1. The left ventricle has normal systolic function of 60-65%. The cavity size is normal. There is no increased left ventricular wall thickness. Left ventricular diastology could not be evaluated due to nondiagnostic images.  2. The right ventricle has normal systolic function. The cavity in normal in size. There is no increase in right ventricular wall thickness.  3. The aortic valve is tricuspid. There is mild thickening of the aortic valve.  4. The pulmonic valve is normal in structure. Pulmonic valve regurgitation was not assessed by color flow Doppler.  5. Limited study; normal LV systolic function; s/p PFO closure with no obvious residual shunt based on color doppler.   ASSESSMENT & PLAN:   PFO s/p PFO closure: she is doing well. Groin site completely healed.  SBE prophylaxis discussed; I have RX'd clindamycin due to a PCN allergy. She will continue on aspirin and plavix x3 months and then aspirin  daily indefinitely. She continues to struggle with intermittent dizziness since her stroke. Her neurologist has Rx'd physical therapy. 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   Medication Adjustments/Labs and Tests Ordered: Current medicines are reviewed at length with the patient today.  Concerns regarding medicines are outlined above.  Medication changes, Labs and Tests ordered today are listed in the Patient Instructions below. Patient Instructions  Medication Instructions:  You may STOP PLAVIX around the 3 month mark when your medication runs out.  Follow-Up:     Byrd Hesselbach, PA-C  08/29/2018 3:43 PM    [facility] [address] [place], [place], [place]  [address] Phone: [phone]; Fax: [phone]

## 2018-08-29 ENCOUNTER — Encounter (INDEPENDENT_AMBULATORY_CARE_PROVIDER_SITE_OTHER): Payer: Self-pay

## 2018-08-29 ENCOUNTER — Other Ambulatory Visit: Payer: Self-pay | Admitting: Physician Assistant

## 2018-08-29 ENCOUNTER — Other Ambulatory Visit: Payer: Self-pay

## 2018-08-29 ENCOUNTER — Encounter: Payer: Self-pay | Admitting: Physician Assistant

## 2018-08-29 ENCOUNTER — Ambulatory Visit: Payer: BC Managed Care – PPO | Admitting: Physician Assistant

## 2018-08-29 DIAGNOSIS — Z8774 Personal history of (corrected) congenital malformations of heart and circulatory system: Secondary | ICD-10-CM

## 2018-08-29 NOTE — Patient Instructions (Addendum)
Medication Instructions:  You may STOP PLAVIX around the 3 month mark when your medication runs out.  Follow-Up: You have appointments scheduled on July 31, 2019 for an echocardiogram and office visit. Please arrive by 12:30PM for these appointments.

## 2018-08-29 NOTE — Telephone Encounter (Signed)
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  It is recommended that she at least participates in 1 session of therapy at least to be able to have examples of exercises that she can do on her own at home if she is unable to afford weekly visits.

## 2018-08-30 NOTE — Telephone Encounter (Signed)
Letter mailed to pt her request.

## 2018-08-30 NOTE — Telephone Encounter (Signed)
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 I stated a copy was fax to human resources. Pt wanted a copy email.I explain to pt a copy cannot be email. A copy can be mailed to her. I verified her address. I also explained Shanda Bumps NP wants her to schedule one session of therapy to get home exercises they recommended.Pt states she has an appt at 0100pm today with the outpatient therapy office. Pt verbalized understanding.

## 2018-08-30 NOTE — Telephone Encounter (Signed)
Letter fax to human resources at [phone]. Fax confirmed x2.

## 2018-09-11 ENCOUNTER — Telehealth: Payer: Self-pay | Admitting: *Deleted

## 2018-09-11 NOTE — Telephone Encounter (Signed)
Pt need a return to work note for 09/14/18. Please call [phone].

## 2018-09-12 NOTE — Telephone Encounter (Signed)
Letter fax to [facility] at [phone] twice and confirmed. Letter put in mail to pt . Address was verify again and mailed to pt.

## 2018-09-12 NOTE — Telephone Encounter (Signed)
I called patient to let her know letter was ready. Copy will be sent to her address and fax to human resources at [phone] Att Peter Congo.

## 2018-09-12 NOTE — Telephone Encounter (Signed)
Patient is calling in for a follow up on note

## 2018-10-18 ENCOUNTER — Telehealth: Payer: Self-pay | Admitting: Cardiovascular Disease

## 2018-10-18 NOTE — Telephone Encounter (Signed)
Her PFO closure would not be affected by a tanning bed.

## 2018-10-18 NOTE — Telephone Encounter (Signed)
Patient called stating she had a heart procedure done back in January she wants to know if it's safe to go into a tanning bed.

## 2018-10-18 NOTE — Telephone Encounter (Signed)
Informed the patient her PFO closure will not be affected by tanning bed. She was grateful for call and agrees with treatment plan.

## 2018-12-04 ENCOUNTER — Telehealth: Payer: Self-pay

## 2018-12-04 NOTE — Telephone Encounter (Signed)
Tried to call pt that appt will be change to mychart video visit due to COVID 19. Pt needs to be set up for mychart and link can be text to her. Need verbal consent to do video and file insurance.

## 2018-12-06 ENCOUNTER — Ambulatory Visit: Payer: BC Managed Care – PPO | Admitting: Adult Health

## 2019-03-12 ENCOUNTER — Telehealth: Payer: Self-pay | Admitting: *Deleted

## 2019-03-12 NOTE — Telephone Encounter (Signed)
Spoke with Krystal Oneal of valve clinic, we do not recommend SBE prophylaxis since she is already outside 6 month period after her PFO closure.

## 2019-03-12 NOTE — Telephone Encounter (Signed)
Discussed with our clinical pharmacist. Per Krystal Oneal, the [place] guidelines recommend 6 months after the procedure unless there was problems with "residual defects  Will double check with Krystal Oneal to did the procedure to see if he would still recommend SBE prophylaxis for dental clinic in patient

## 2019-03-12 NOTE — Telephone Encounter (Signed)
Clearance received today, dental cleaning is tomorrow. Will check with our valve clinic to see if SBE prophylaxis is needed after PFO closure. Otherwise dental cleaning is a low risk procedure and she is cleared.

## 2019-03-12 NOTE — Telephone Encounter (Signed)
Agree. thanks

## 2019-03-12 NOTE — Telephone Encounter (Signed)
   [facility] Pre-operative Risk Assessment    Request for surgical clearance:  1. What type of surgery is being performed? DENTAL CLEANING    2. When is this surgery scheduled? 03/13/19   3. What type of clearance is required (medical clearance vs. Pharmacy clearance to hold med vs. Both)? MEDICAL  4. Are there any medications that need to be held prior to surgery and how long? ASA AND PLAVIX; PT ALSO HAD RECENT PFO CLOSURE, DDS NEEDS TO KNOW DOES PT NEED ANTIBIOTIC?   5. Practice name and name of physician performing surgery? WALLACE CARRILLO-MEDINA, DMD   6. What is your office phone number [phone]    7.   What is your office fax number [phone]  8.   Anesthesia type (None, local, MAC, general) ? NONE    Julaine Hua 03/12/2019, 1:43 PM  _________________________________________________________________   (provider comments below)

## 2019-03-12 NOTE — Telephone Encounter (Signed)
   Primary Cardiologist: Sherren Mocha, MD  Chart reviewed as part of pre-operative protocol coverage. Dental cleaning is low risk and no further cardiac workup is needed   SBE prophylaxis is not required for the patient.  I will route this recommendation to the requesting party via Epic fax function and remove from pre-op pool.  Please call with questions.  [place], [place] 03/12/2019, 4:55 PM

## 2019-03-13 NOTE — Telephone Encounter (Signed)
Faxed clearance to the requesting this morning. Called the office and the fax was received.

## 2019-06-13 IMAGING — CT CT ANGIO NECK
1 of 11 series · 5 of 33 positions shown · IV contrast (OMNI 350)
Comparison: MRI 07/04/2018

CLINICAL DATA: Code stroke.  Aphasia.  Left-sided weakness.

EXAM:
CT ANGIOGRAPHY HEAD AND NECK
TECHNIQUE: Multidetector CT imaging of the head and neck was performed using
the standard protocol during bolus administration of intravenous
contrast. Multiplanar CT image reconstructions and MIPs were
obtained to evaluate the vascular anatomy. Carotid stenosis
measurements (when applicable) are obtained utilizing NASCET
criteria, using the distal internal carotid diameter as the
denominator.
CONTRAST:  75mL [id number] IOPAMIDOL ([id number]-[id number]) INJECTION 76%

[Series 11: cta neck axial · axial · 0.39mm/px · z∈[-231,+1]mm · 5 of 350 slices shown]
[im 59/350  soft-tissue]
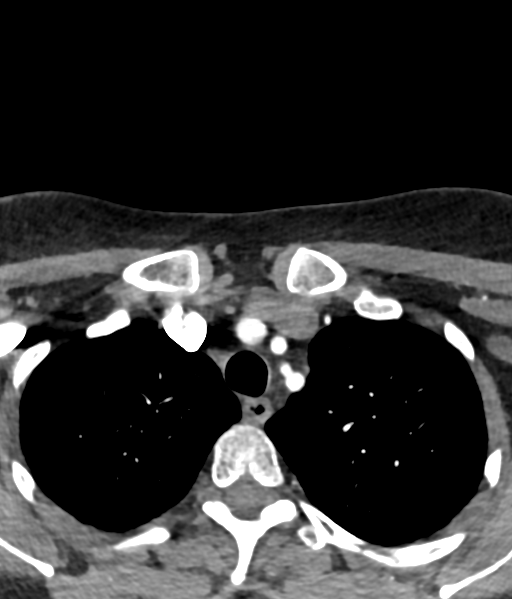
[im 117/350  bone]
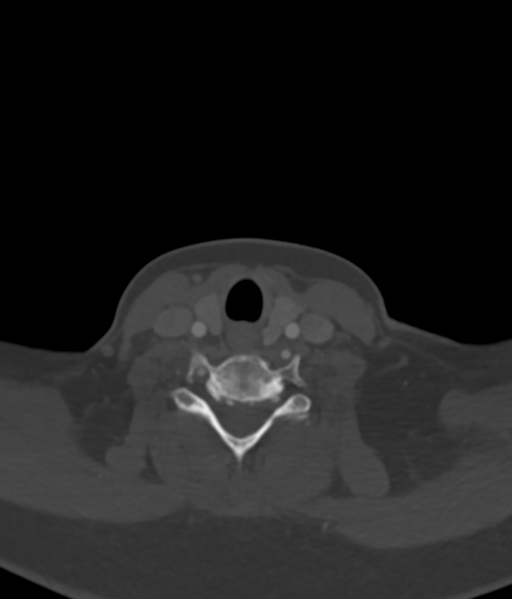
[im 175/350  soft-tissue]
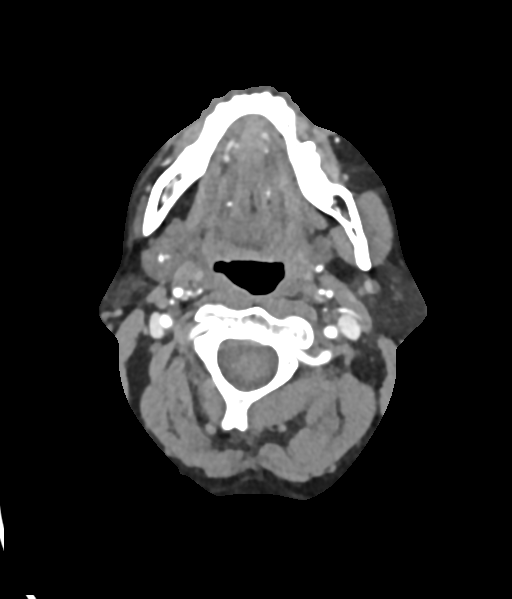
[im 233/350  bone]
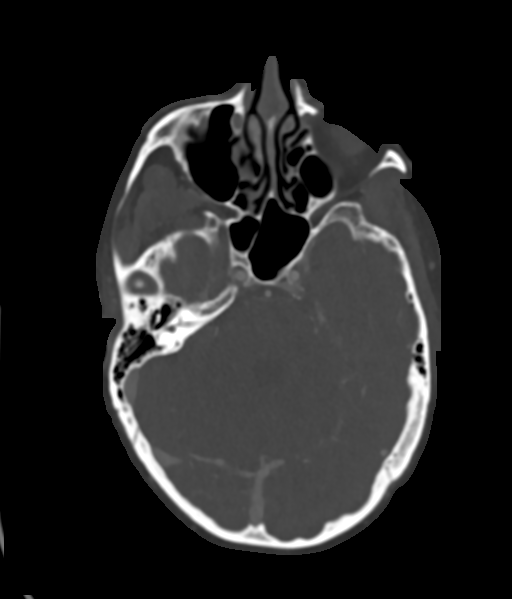
[im 291/350  soft-tissue]
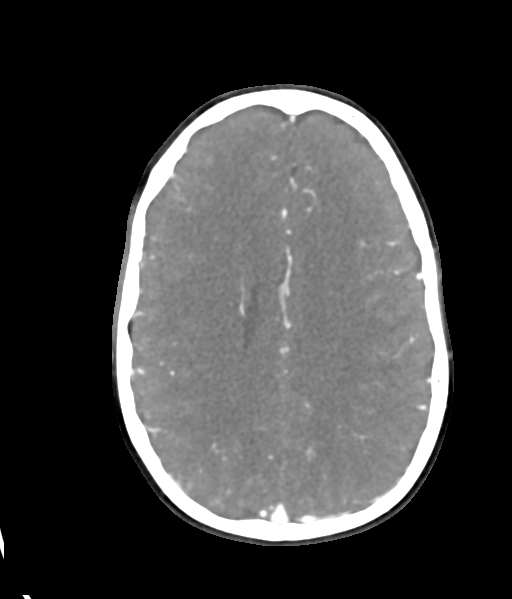

[5 of 33 positions shown; findings below may reference images not displayed]

FINDINGS: CT HEAD FINDINGS

Brain: No evidence of acute infarction, hemorrhage, hydrocephalus,
extra-axial collection or mass lesion/mass effect.

Vascular: Negative for hyperdense vessel

Skull: Negative

Sinuses: Negative

Orbits: Negative

Review of the MIP images confirms the above findings

CTA NECK FINDINGS

Aortic arch: Normal aortic arch. Negative for dissection or
atherosclerotic disease. Left vertebral artery origin from the arch

Right carotid system: Normal right carotid without stenosis or
irregularity.

Left carotid system: Normal left carotid without stenosis or
irregularity

Vertebral arteries: Both vertebral arteries widely patent without
stenosis or irregularity. Left vertebral artery origin from the
aortic arch.

Skeleton: Negative

Other neck: Negative

Upper chest: Negative

Review of the MIP images confirms the above findings

CTA HEAD FINDINGS

Anterior circulation: Cavernous carotid normal bilaterally. Proximal
anterior and middle cerebral arteries widely patent bilaterally
without stenosis.

Occluded branch of distal right MCA in the right parietal region.
This corresponds to right M3/M4 occluded segment and corresponds to
the area of restricted diffusion on MRI. This is likely a small
embolus. No other branch occlusions.

Posterior circulation: Both vertebral arteries widely patent to the
basilar. PICA patent bilaterally. Basilar widely patent. AICA,
superior cerebellar, and posterior cerebral arteries widely patent

Venous sinuses: Patent

Anatomic variants: None

Delayed phase: Not performed

Review of the MIP images confirms the above findings
IMPRESSION: 1. Right M3/M4 branch occlusion in the right parietal lobe
corresponding to acute infarct on MRI. This is presumably an embolus
from the heart. No other significant intracranial or extracranial
disease.
2. These results were called by telephone at the time of
interpretation on 07/04/2018 at [DATE] to Dr. Speaker , who verbally
acknowledged these results.

## 2019-07-29 ENCOUNTER — Telehealth: Payer: BC Managed Care – PPO | Admitting: Adult Health

## 2019-07-29 ENCOUNTER — Ambulatory Visit: Payer: BC Managed Care – PPO | Admitting: Adult Health

## 2019-07-29 ENCOUNTER — Telehealth: Payer: Self-pay | Admitting: Adult Health

## 2019-07-29 NOTE — Telephone Encounter (Signed)
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 Pt had difficulty getting on for her Virtual Visit. Please advise.

## 2019-07-29 NOTE — Telephone Encounter (Signed)
Andrey Campanile will you be able to get that since the phones are crazy being a Monday ? I doubt ill be able to get it anytime soon. Thanks.

## 2019-07-29 NOTE — Progress Notes (Deleted)
[facility] [address] [place]. [place] [address] [phone]       OFFICE FOLLOW UP NOTE  [name]:  May 09, 1971 Medical Record Number:  [id number]   Reason for Referral: stroke follow up  CHIEF COMPLAINT:  No chief complaint on file.   HPI:  Stroke admission 07/04/2018: [name] is a 49 y.o. female with history of anxiety who presented with word finding difficulty and weakness of the left arm.  CT/CTA head and neck showed right M3/M4 branch occlusion in the right parietal lobe.  MRI brain reviewed and showed acute infarct posterior right MCA territory involving the posterior insula and right parietal lobe in the region of the motor cortex.  TTE showed an EF of 55 to 60%.  TEE showed evidence of large PFO and ASA.  Lower extremity venous Dopplers negative for DVT.  Due to evidence of large PFO and ASA, recommended initiating Eliquis 5 mg twice daily until outpatient PFO closure with Dr. Burt Knack.  LDL 79 and initiated atorvastatin 20 mg daily.  A1c 5.3.  Other stroke risk factors include family history of stroke (mother).  He was discharged home in stable condition without therapy needs.  Initial visit 08/16/2018: She is being seen today for follow-up visit and is accompanied by her husband.  She did undergo PFO closure on 07/25/2018 which was tolerated well without any reported complications.  She continues to have residual deficits of intermittent dizziness which she will experience daily and will typically start midmorning and will come and go throughout the day.  She has not associated dizziness sensation with any type of position change or head movement.  This sensation can occur while sitting but also while ambulating.  She has been experiencing the sensation since hospital discharge.  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  She was previously receiving FMLA through her PCP for 6 weeks post stroke therefore will end tomorrow.  She is fearful to return at this time due to ongoing dizziness that occurs randomly.  She does not typically monitor BP at home as she states she has never had any type of blood pressure issue.  She typically drinks 2 cups of coffee in the morning, a small bottle of Sprite in the afternoon and potentially 2 bottles of water throughout the day.  She is able to ambulate without use of assistive device but when dizziness sensation occurs, she will have to grab on 10 years objects to stabilize herself.  She has not participated in any type of therapies.  She continues on aspirin and Plavix which was recommended post PFO closure without reported bleeding or bruising.  She continues on atorvastatin 20 mg daily without side effects myalgias.  Blood pressure today satisfactory 126/78.  Denies new or worsening stroke/TIA symptoms.   Update via virtual visit 07/29/2019: Krystal Oneal is a 49 year old female who is being seen today via virtual visit for stroke follow-up.  Residual stroke deficits ***.  She continues on aspirin 81 mg daily and atorvastatin 20 mg daily for secondary stroke prevention without side effects.  Blood pressure today ***.  Denies new or worsening stroke/TIA symptoms.     ROS:   14 system review of systems performed and negative with exception of fatigue and dizziness  PMH:  Past Medical History:  Diagnosis Date  .  Anxiety   . CVA (cerebral vascular accident) (HCC) 07/04/2018  . PFO (patent foramen ovale)    closed 07/25/18  . Vision abnormalities     PSH:  Past Surgical History:  Procedure Laterality Date  . HYDRADENITIS EXCISION    . PATENT FORAMEN OVALE(PFO) CLOSURE N/A 07/25/2018   Procedure: PATENT FORAMEN OVALE (PFO) CLOSURE;  Surgeon: Tonny Bollman, MD;  Location: [facility] INVASIVE CV LAB;  Service: Cardiovascular;  Laterality: N/A;  .  TEE WITHOUT CARDIOVERSION N/A 07/06/2018   Procedure: TRANSESOPHAGEAL ECHOCARDIOGRAM (TEE);  Surgeon: Elease Hashimoto Deloris Ping, MD;  Location: [facility] ENDOSCOPY;  Service: Cardiovascular;  Laterality: N/A;    Social History:  Social History   Socioeconomic History  . Marital status: Married    Spouse name: Khadijah Mastrianni  . Number of children: 2  . Years of education: Not on file  . Highest education level: High school graduate  Occupational History  . Occupation: Geologist, engineering    Comment: TEFL teacher  Tobacco Use  . Smoking status: Former Smoker    Packs/day: 1.00    Years: 30.00    Pack years: 30.00    Types: Cigarettes    Quit date: 2017    Years since quitting: 4.1  . Smokeless tobacco: Never Used  Substance and Sexual Activity  . Alcohol use: Never  . Drug use: Never  . Sexual activity: Not on file  Other Topics Concern  . Not on file  Social History Narrative  . Not on file   Social Determinants of Health   Financial Resource Strain:   . Difficulty of Paying Living Expenses: Not on file  Food Insecurity:   . Worried About Programme researcher, broadcasting/film/video in the Last Year: Not on file  . Ran Out of Food in the Last Year: Not on file  Transportation Needs:   . Lack of Transportation (Medical): Not on file  . Lack of Transportation (Non-Medical): Not on file  Physical Activity:   . Days of Exercise per Week: Not on file  . Minutes of Exercise per Session: Not on file  Stress:   . Feeling of Stress : Not on file  Social Connections:   . Frequency of Communication with Friends and Family: Not on file  . Frequency of Social Gatherings with Friends and Family: Not on file  . Attends Religious Services: Not on file  . Active Member of Clubs or Organizations: Not on file  . Attends Banker Meetings: Not on file  . Marital Status: Not on file  Intimate Partner Violence:   . Fear of Current or Ex-Partner: Not on file  . Emotionally Abused: Not on file  .  Physically Abused: Not on file  . Sexually Abused: Not on file    Family History:  Family History  Problem Relation Age of Onset  . Hypertension Mother   . CVA Mother 58  . Chronic Renal Failure Mother   . Hypertension Father   . Other Maternal Grandmother        died during childbirth  . Leukemia Maternal Grandfather   . Heart disease Paternal Grandmother   . Heart disease Paternal Grandfather   . Hypertension Brother   . Breast cancer Maternal Aunt   . Stomach cancer Maternal Aunt   . Lung cancer Half-Sister     Medications:   Current Outpatient Medications on File Prior to Visit  Medication Sig Dispense Refill  . aspirin EC 81 MG tablet Take 1 tablet (81 mg total) by  mouth daily. 90 tablet 3  . atorvastatin (LIPITOR) 20 MG tablet Take 1 tablet (20 mg total) by mouth daily at 6 PM. 30 tablet 0  . Calcium Carb-Cholecalciferol (CALCIUM 600 + D PO) Take 1 tablet by mouth daily.    . clonazePAM (KLONOPIN) 0.5 MG tablet Take 0.5 mg by mouth at bedtime.     Marland Kitchen loratadine (CLARITIN) 10 MG tablet Take 10 mg by mouth daily.    . Multiple Vitamin (MULTIVITAMIN WITH MINERALS) TABS tablet Take 1 tablet by mouth daily.    . nicotine polacrilex (NICORETTE) 4 MG gum Take 4 mg by mouth as needed for smoking cessation.    . Probiotic Product (PROBIOTIC DAILY PO) Take 1 tablet by mouth daily.    . vitamin B-12 1000 MCG tablet Take 1 tablet (1,000 mcg total) by mouth daily. 30 tablet 0   No current facility-administered medications on file prior to visit.    Allergies:   Allergies  Allergen Reactions  . Sulfa Antibiotics Itching  . Amoxicillin Hives and Rash  . Penicillins Hives and Rash    Did it involve swelling of the face/tongue/throat, SOB, or low BP? Yes Did it involve sudden or severe rash/hives, skin peeling, or any reaction on the inside of your mouth or nose? Yes Did you need to seek medical attention at a hospital or doctor's office? No When did it last happen?2 years  ago If all above answers are "NO", may proceed with cephalosporin use.      Physical Exam  There were no vitals filed for this visit. There is no height or weight on file to calculate BMI. No exam data present  Depression screen [facility] 2/9 08/16/2018  Decreased Interest 0  Down, Depressed, Hopeless 0  PHQ - 2 Score 0     General: well developed, well nourished, pleasant middle-aged Caucasian female, seated, in no evident distress Head: head normocephalic and atraumatic.   Neck: supple with no carotid or supraclavicular bruits Cardiovascular: regular rate and rhythm, no murmurs Musculoskeletal: no deformity Skin:  no rash/petichiae Vascular:  Normal pulses all extremities  Neurologic Exam Mental Status: Awake and fully alert. Oriented to place and time. Recent and remote memory intact. Attention span, concentration and fund of knowledge appropriate. Mood and affect appropriate.  Cranial Nerves: Fundoscopic exam reveals sharp disc margins. Pupils equal, briskly reactive to light. Extraocular movements full without nystagmus. Visual fields full to confrontation. Hearing intact. Facial sensation intact. Face, tongue, palate moves normally and symmetrically.  Motor: Normal bulk and tone. Normal strength in all tested extremity muscles. Sensory.: intact to touch , pinprick , position and vibratory sensation.  Coordination: Rapid alternating movements normal in all extremities. Finger-to-nose and heel-to-shin performed accurately bilaterally. Gait and Station: Arises from chair without difficulty. Stance is normal. Gait demonstrates slow gait. Able to heel, toe and tandem walk with mild difficulty.  Romberg negative Reflexes: 1+ and symmetric. Toes downgoing.    NIHSS  0 Modified Rankin  2    Diagnostic Data (Labs, Imaging, Testing)  CT HEAD WO CONTRAST CT ANGIO HEAD W OR WO CONTRAST CT ANGIO NECK W OR WO CONTRAST 07/04/2018 IMPRESSION: 1. Right M3/M4 branch occlusion in the right  parietal lobe corresponding to acute infarct on MRI. This is presumably an embolus from the heart. No other significant intracranial or extracranial disease.  MR BRAIN WO CONTRAST 07/04/2018 IMPRESSION: Acute infarct posterior right MCA territory involving the posterior insula and right parietal lobe in the region of the motor cortex. Negative  FLAIR imaging. Probable clot in distal right MCA branch versus focal microhemorrhage. Increased flow in this vessel on FLAIR suggests slow flow.  ECHO TEE 07/06/2018 Study Conclusions - Left ventricle: Systolic function was normal. The estimated   ejection fraction was in the range of 60% to 65%. Wall motion was   normal; there were no regional wall motion abnormalities. - Aortic valve: No evidence of vegetation. - Mitral valve: No evidence of vegetation. - Left atrium: No evidence of thrombus in the atrial cavity or   appendage. - Atrial septum: There was a large patent foramen ovale. - Tricuspid valve: No evidence of vegetation.    ASSESSMENT: Krystal Oneal is a 49 y.o. year old female here with right posterior MCA infarct embolic pattern with right M3/M4 branch occlusion on 07/04/2018 secondary to large PFO and ASA. Vascular risk factors include HLD, PFO and ASA.  She did undergo PFO closure on 07/25/2018 without complication.  She is being seen today for hospital follow-up with continued dizziness sensation.    PLAN:  1. Right posterior MCA infarct: Continue aspirin 81 mg daily and clopidogrel 75 mg daily  and atorvastatin for secondary stroke prevention. Maintain strict control of hypertension with blood pressure goal below 130/90, diabetes with hemoglobin A1c goal below 6.5% and cholesterol with LDL cholesterol (bad cholesterol) goal below 70 mg/dL.  I also advised the patient to eat a healthy diet with plenty of whole grains, cereals, fruits and vegetables, exercise regularly with at least 30 minutes of continuous activity daily and  maintain ideal body weight. 2. Dizziness: Referral placed for outpatient physical therapy.  It was also advised to increase fluid intake as part of these episodes could be due to decreased intake.  Also encouraged increasing activity level as tolerated as she does states she has not been active since she has been discharged.  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  Recommended to stay out of work for an additional 4 weeks or return earlier if able.  FMLA form will be provided 3. S/p PFO: Continue DAPT along with continued follow-up with Dr. Excell Seltzer for decision on DAPT duration and routine follow-up/monitoring 4. HLD: Advised to continue current treatment regimen along with continued follow-up with PCP for future prescribing and monitoring of lipid panel 5. Recommended monitoring BP at home to ensure dizziness episodes are not related to hypotension   Follow up in 3 months or call earlier if needed   Greater than 50% of time during this 25 minute visit was spent on counseling, explanation of diagnosis of right MCA infarct, reviewing risk factor management of HLD, planning of further management along with potential future management, and discussion with patient and family answering all questions.    Ihor Austin, AGNP-BC  [facility] [address] [place], [place] [address]  Phone [phone] Fax [phone] Note: This document was prepared with digital dictation and possible smart phrase technology. Any transcriptional errors that result from this process are unintentional.

## 2019-07-29 NOTE — Telephone Encounter (Signed)
I LMVM for pt to return call have opening for in office slot on 07-31-19 at 1245 JM/NP (saved) Please return call. To confirm.

## 2019-07-29 NOTE — Telephone Encounter (Signed)
Discussed with Aundra Millet, NP.  Okay for patient to be seen by one of the other NPs for a one-time visit especially is requesting sooner visit.  If no other sooner visit, she can be seen by Dr. Pearlean Brownie if he has availability or placed on cancellation list.

## 2019-07-30 ENCOUNTER — Encounter: Payer: Self-pay | Admitting: Adult Health

## 2019-07-30 NOTE — Telephone Encounter (Signed)
Pt called back this am and will take the 07-31-19 appt at 1245 to see JM/NP for appt (in office).

## 2019-07-31 ENCOUNTER — Other Ambulatory Visit (HOSPITAL_COMMUNITY): Payer: BC Managed Care – PPO

## 2019-07-31 ENCOUNTER — Ambulatory Visit: Payer: Self-pay | Admitting: Adult Health

## 2019-07-31 ENCOUNTER — Ambulatory Visit: Payer: BC Managed Care – PPO | Admitting: Physician Assistant

## 2019-08-01 ENCOUNTER — Encounter: Payer: Self-pay | Admitting: Adult Health

## 2019-08-01 ENCOUNTER — Other Ambulatory Visit: Payer: Self-pay

## 2019-08-01 ENCOUNTER — Ambulatory Visit: Payer: BC Managed Care – PPO | Admitting: Adult Health

## 2019-08-01 ENCOUNTER — Encounter: Payer: Self-pay | Admitting: *Deleted

## 2019-08-01 VITALS — BP 114/59 | HR 74 | Temp 98.0°F | Ht 62.0 in | Wt 152.0 lb

## 2019-08-01 DIAGNOSIS — I253 Aneurysm of heart: Secondary | ICD-10-CM

## 2019-08-01 DIAGNOSIS — I63411 Cerebral infarction due to embolism of right middle cerebral artery: Secondary | ICD-10-CM | POA: Diagnosis not present

## 2019-08-01 DIAGNOSIS — E785 Hyperlipidemia, unspecified: Secondary | ICD-10-CM | POA: Diagnosis not present

## 2019-08-01 DIAGNOSIS — Q211 Atrial septal defect: Secondary | ICD-10-CM

## 2019-08-01 NOTE — Telephone Encounter (Signed)
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 Thank you

## 2019-08-01 NOTE — Patient Instructions (Signed)
Continue aspirin 81 mg daily  and lipitor  for secondary stroke prevention  Continue to follow up with PCP regarding cholesterol and blood pressure management  Continue to monitor blood pressure at home  Maintain strict control of hypertension with blood pressure goal below 130/90, diabetes with hemoglobin A1c goal below 6.5% and cholesterol with LDL cholesterol (bad cholesterol) goal below 70 mg/dL. I also advised the patient to eat a healthy diet with plenty of whole grains, cereals, fruits and vegetables, exercise regularly and maintain ideal body weight.         Thank you for coming to see us at [facility]. I hope we have been able to provide you high quality care today.  You may receive a patient satisfaction survey over the next few weeks. We would appreciate your feedback and comments so that we may continue to improve ourselves and the health of our patients.  

## 2019-08-01 NOTE — Progress Notes (Signed)
[facility] [address] [place]. [place] [address] [phone]       OFFICE FOLLOW UP NOTE  Krystal Oneal Date of Birth:  1970-06-26 Medical Record Number:  [id number]   Reason for Referral: stroke follow up [facility] provider: Dr. Pearlean Brownie PCP: Everlean Cherry, MD    CHIEF COMPLAINT:  Chief Complaint  Krystal Oneal presents with  . Follow-up    TxRm. Alone. No questions nor concerns.     HPI:   Update 08/01/2019: Krystal Oneal is a 49 year old female who is being seen today for stroke follow-up.  Krystal Oneal was last evaluated almost a year ago on 08/16/2018 as Krystal Oneal did not recently follow-up as recommended.  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  Recovered well from a stroke standpoint without residual deficits.  Krystal Oneal continues on aspirin 81 mg daily and atorvastatin 20 mg daily for secondary stroke prevention without side effects.  Prior physical with PCP showed LDL 65.  Blood pressure today 114/59.  Denies new or worsening stroke/TIA symptoms.     History copied for reference purposes only Stroke admission 07/04/2018: Krystal Oneal is a 49 y.o. female with history of anxiety who presented with word finding difficulty and weakness of the left arm.  CT/CTA head and neck showed right M3/M4 branch occlusion in the right parietal lobe.  MRI brain reviewed and showed acute infarct posterior right MCA territory involving the posterior insula and right parietal lobe in the region of the motor cortex.  TTE showed an EF of 55 to 60%.  TEE showed evidence of large PFO and ASA.  Lower extremity venous Dopplers negative for DVT.  Due to evidence of large PFO and ASA, recommended initiating Eliquis 5 mg twice daily until outpatient PFO closure with Dr. Excell Seltzer.  LDL 79 and initiated atorvastatin 20 mg daily.  A1c 5.3.  Other stroke risk factors include family history of stroke (mother).  He was discharged home in stable condition  without therapy needs.  Initial visit 08/16/2018: Krystal Oneal is being seen today for follow-up visit and is accompanied by her husband.  Krystal Oneal did undergo PFO closure on 07/25/2018 which was tolerated well without any reported complications.  Krystal Oneal continues to have residual deficits of intermittent dizziness which Krystal Oneal will experience daily and will typically start midmorning and will come and go throughout the day.  Krystal Oneal has not associated dizziness sensation with any type of position change or head movement.  This sensation can occur while sitting but also while ambulating.  Krystal Oneal has been experiencing the sensation since hospital discharge.  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  Krystal Oneal was previously receiving FMLA through her PCP for 6 weeks post stroke therefore will end tomorrow.  Krystal Oneal is fearful to return at this time due to ongoing dizziness that occurs randomly.  Krystal Oneal does not typically monitor BP at home as Krystal Oneal states Krystal Oneal has never had any type of blood pressure issue.  Krystal Oneal typically drinks 2 cups of coffee in the morning, a small bottle of Sprite in the afternoon and potentially 2 bottles of water throughout the day.  Krystal Oneal is able to ambulate without use of assistive device but when dizziness sensation occurs, Krystal Oneal will have to grab on 10 years objects to stabilize herself.  Krystal Oneal  has not participated in any type of therapies.  Krystal Oneal continues on aspirin and Plavix which was recommended post PFO closure without reported bleeding or bruising.  Krystal Oneal continues on atorvastatin 20 mg daily without side effects myalgias.  Blood pressure today satisfactory 126/78.  Denies new or worsening stroke/TIA symptoms.        ROS:   14 system review of systems performed and negative with exception of no complaints  PMH:  Past Medical History:  Diagnosis Date  . Anxiety   .  CVA (cerebral vascular accident) ([facility]) 07/04/2018  . PFO (patent foramen ovale)    closed 07/25/18  . Vision abnormalities     PSH:  Past Surgical History:  Procedure Laterality Date  . HYDRADENITIS EXCISION    . PATENT FORAMEN OVALE(PFO) CLOSURE N/A 07/25/2018   Procedure: PATENT FORAMEN OVALE (PFO) CLOSURE;  Surgeon: Sherren Mocha, MD;  Location: [facility];  Service: Cardiovascular;  Laterality: N/A;  . TEE WITHOUT CARDIOVERSION N/A 07/06/2018   Procedure: TRANSESOPHAGEAL ECHOCARDIOGRAM (TEE);  Surgeon: Acie Fredrickson Wonda Cheng, MD;  Location: [facility] ENDOSCOPY;  Service: Cardiovascular;  Laterality: N/A;    Social History:  Social History   Socioeconomic History  . Marital status: Married    Spouse name: Chalonda Schlatter  . Number of children: 2  . Years of education: Not on file  . Highest education level: High school graduate  Occupational History  . Occupation: Control and instrumentation engineer    Comment: Therapist, sports  Tobacco Use  . Smoking status: Former Smoker    Packs/day: 1.00    Years: 30.00    Pack years: 30.00    Types: Cigarettes    Quit date: 2017    Years since quitting: 4.1  . Smokeless tobacco: Never Used  Substance and Sexual Activity  . Alcohol use: Never  . Drug use: Never  . Sexual activity: Not on file  Other Topics Concern  . Not on file  Social History Narrative  . Not on file   Social Determinants of Health   Financial Resource Strain:   . Difficulty of Paying Living Expenses: Not on file  Food Insecurity:   . Worried About Charity fundraiser in the Last Year: Not on file  . Ran Out of Food in the Last Year: Not on file  Transportation Needs:   . Lack of Transportation (Medical): Not on file  . Lack of Transportation (Non-Medical): Not on file  Physical Activity:   . Days of Exercise per Week: Not on file  . Minutes of Exercise per Session: Not on file  Stress:   . Feeling of Stress : Not on file  Social Connections:   . Frequency of  Communication with Friends and Family: Not on file  . Frequency of Social Gatherings with Friends and Family: Not on file  . Attends Religious Services: Not on file  . Active Member of Clubs or Organizations: Not on file  . Attends Archivist Meetings: Not on file  . Marital Status: Not on file  Intimate Partner Violence:   . Fear of Current or Ex-Partner: Not on file  . Emotionally Abused: Not on file  . Physically Abused: Not on file  . Sexually Abused: Not on file    Family History:  Family History  Problem Relation Age of Onset  . Hypertension Mother   . CVA Mother 30  . Chronic Renal Failure Mother   . Hypertension Father   . Other Maternal Grandmother  died during childbirth  . Leukemia Maternal Grandfather   . Heart disease Paternal Grandmother   . Heart disease Paternal Grandfather   . Hypertension Brother   . Breast cancer Maternal Aunt   . Stomach cancer Maternal Aunt   . Lung cancer Half-Sister     Medications:   Current Outpatient Medications on File Prior to Visit  Medication Sig Dispense Refill  . aspirin EC 81 MG tablet Take 1 tablet (81 mg total) by mouth daily. 90 tablet 3  . atorvastatin (LIPITOR) 20 MG tablet Take 1 tablet (20 mg total) by mouth daily at 6 PM. 30 tablet 0  . clonazePAM (KLONOPIN) 0.5 MG tablet Take 0.5 mg by mouth at bedtime.     [facility] loratadine (CLARITIN) 10 MG tablet Take 10 mg by mouth daily.    . Multiple Vitamin (MULTIVITAMIN WITH MINERALS) TABS tablet Take 1 tablet by mouth daily.    . nicotine polacrilex (NICORETTE) 4 MG gum Take 4 mg by mouth as needed for smoking cessation.     No current facility-administered medications on file prior to visit.    Allergies:   Allergies  Allergen Reactions  . Sulfa Antibiotics Itching  . Amoxicillin Hives and Rash  . Penicillins Hives and Rash    Did it involve swelling of the face/tongue/throat, SOB, or low BP? Yes Did it involve sudden or severe rash/hives, skin peeling,  or any reaction on the inside of your mouth or nose? Yes Did you need to seek medical attention at a hospital or doctor's office? No When did it last happen?2 years ago If all above answers are "NO", may proceed with cephalosporin use.      Physical Exam  Vitals:   08/01/19 0850  BP: (!) 114/59  Pulse: 74  Temp: 98 F (36.7 C)  Weight: 152 lb (68.9 kg)  Height: 5\' 2"  (1.575 m)   Body mass index is 27.8 kg/m. No exam data present  General: well developed, well nourished, pleasant middle-aged Caucasian female, seated, in no evident distress Head: head normocephalic and atraumatic.   Neck: supple with no carotid or supraclavicular bruits Cardiovascular: regular rate and rhythm, no murmurs Musculoskeletal: no deformity Skin:  no rash/petichiae Vascular:  Normal pulses all extremities  Neurologic Exam Mental Status: Awake and fully alert.  Normal speech and language.  Oriented to place and time. Recent and remote memory intact. Attention span, concentration and fund of knowledge appropriate. Mood and affect appropriate.  Cranial Nerves:  Pupils equal, briskly reactive to light. Extraocular movements full without nystagmus. Visual fields full to confrontation. Hearing intact. Facial sensation intact. Face, tongue, palate moves normally and symmetrically.  Motor: Normal bulk and tone. Normal strength in all tested extremity muscles. Sensory.: intact to touch , pinprick , position and vibratory sensation.  Coordination: Rapid alternating movements normal in all extremities. Finger-to-nose and heel-to-shin performed accurately bilaterally. Gait and Station: Arises from chair without difficulty. Stance is normal. Gait demonstrates slow gait. Able to heel, toe and tandem walk without difficulty.  Romberg negative Reflexes: 1+ and symmetric. Toes downgoing.       Diagnostic Data (Labs, Imaging, Testing)  CT HEAD WO CONTRAST CT ANGIO HEAD W OR WO CONTRAST CT ANGIO NECK W OR  WO CONTRAST 07/04/2018 IMPRESSION: 1. Right M3/M4 branch occlusion in the right parietal lobe corresponding to acute infarct on MRI. This is presumably an embolus from the heart. No other significant intracranial or extracranial disease.  MR BRAIN WO CONTRAST 07/04/2018 IMPRESSION: Acute infarct posterior right  MCA territory involving the posterior insula and right parietal lobe in the region of the motor cortex. Negative FLAIR imaging. Probable clot in distal right MCA branch versus focal microhemorrhage. Increased flow in this vessel on FLAIR suggests slow flow.  ECHO TEE 07/06/2018 Study Conclusions - Left ventricle: Systolic function was normal. The estimated   ejection fraction was in the range of 60% to 65%. Wall motion was   normal; there were no regional wall motion abnormalities. - Aortic valve: No evidence of vegetation. - Mitral valve: No evidence of vegetation. - Left atrium: No evidence of thrombus in the atrial cavity or   appendage. - Atrial septum: There was a large patent foramen ovale. - Tricuspid valve: No evidence of vegetation.    ASSESSMENT: Krystal Oneal is a 49 y.o. year old female here with right posterior MCA infarct embolic pattern with right M3/M4 branch occlusion on 07/04/2018 secondary to large PFO and ASA. Vascular risk factors include HLD, PFO and ASA.  Krystal Oneal did undergo PFO closure on 07/25/2018 without complication.  Krystal Oneal has recovered greatly from a stroke standpoint without residual deficits.    PLAN:  1. Right posterior MCA infarct: Continue aspirin 81 mg daily  and atorvastatin for secondary stroke prevention. Maintain strict control of hypertension with blood pressure goal below 130/90, diabetes with hemoglobin A1c goal below 6.5% and cholesterol with LDL cholesterol (bad cholesterol) goal below 70 mg/dL.  I also advised the Krystal Oneal to eat a healthy diet with plenty of whole grains, cereals, fruits and vegetables, exercise regularly with at least 30  minutes of continuous activity daily and maintain ideal body weight. 2. HLD: Advised to continue current treatment regimen along with continued follow-up with PCP for future prescribing and monitoring of lipid panel 3. 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   Overall stable from stroke standpoint and recommend follow-up as needed   Greater than 50% of time during this 20 minute visit was spent on counseling, reviewing prior diagnosis of right MCA infarct, discussion regarding resolution of prior deficits, reviewing risk factor management of HLD, planning of further management along with potential future management, and discussion with Krystal Oneal answered all questions to satisfaction    Ihor Austin, AGNP-BC  [facility] [address] [place], [place] [address]  Phone [phone] Fax [phone] Note: This document was prepared with digital dictation and possible smart phrase technology. Any transcriptional errors that result from this process are unintentional.

## 2019-08-01 NOTE — Progress Notes (Signed)
I agree with the above plan 

## 2019-08-12 NOTE — Progress Notes (Signed)
[facility]   MULTIDISCIPLINARY HEART VALVE CLINIC                                       Cardiology Office Note    Date:  08/15/2019   ID:  Krystal Oneal, DOB [birth year], MRN [id number]  PCP:  Krystal Cherry, MD  Cardiologist: Dr. Excell Oneal   CC: 1 year s/p PFO closure   History of Present Illness:  Krystal Oneal is a 49 y.o. female with a history of cryptogenic CVA, PFO with recent PFO closure (07/25/18) who presents to clinic for follow up.   She was admitted in 06/2018 for word finding difficulty and left arm weakness. A CTA of the head and neck demonstrated branch occlusion of the right M3 and M4 branches in the parietal lobe. MRI showed acute infarct in the posterior right MCA territory suspicious for cardioembolic event. During her hospitalization she underwent multiple imaging studies demonstrating no evidence of large vessel atherosclerotic disease. Her echocardiogram showed normal LV function, no significant valvular disease, and no right heart dysfunction. She underwent a TEE which demonstrated a large PFO with spontaneous right to left shunting. She was placed on oral anticoagulation with apixaban. She was seen by Dr. Excell Oneal for consideration of PFO closure and underwent successful transcatheter PFO closure under fluoroscopic and intracardiac echo guidance. She was discharged on aspirin and plavix x3 months followed by aspirin 81mg  daily indefinitely. She had ongoing issues with dizziness and neurologist Rxd PT.   Today she presents to clinic for follow up. No CP or SOB. No LE edema, orthopnea or PND. No dizziness or syncope. No blood in stool or urine. No palpitations. No new neurologic symptoms.    Past Medical History:  Diagnosis Date  . Anxiety   . CVA (cerebral vascular accident) ([facility]) 07/04/2018  . PFO (patent foramen ovale)    closed 07/25/18  . Vision abnormalities     Past Surgical History:  Procedure Laterality Date  . HYDRADENITIS EXCISION    . PATENT  FORAMEN OVALE(PFO) CLOSURE N/A 07/25/2018   Procedure: PATENT FORAMEN OVALE (PFO) CLOSURE;  Surgeon: 09/23/2018, MD;  Location: [facility] INVASIVE [facility];  Service: Cardiovascular;  Laterality: N/A;  . TEE WITHOUT CARDIOVERSION N/A 07/06/2018   Procedure: TRANSESOPHAGEAL ECHOCARDIOGRAM (TEE);  Surgeon: 07/08/2018 Krystal Hashimoto, MD;  Location: [facility] ENDOSCOPY;  Service: Cardiovascular;  Laterality: N/A;    Current Medications: Outpatient Medications Prior to Visit  Medication Sig Dispense Refill  . aspirin EC 81 MG tablet Take 1 tablet (81 mg total) by mouth daily. 90 tablet 3  . atorvastatin (LIPITOR) 20 MG tablet Take 1 tablet (20 mg total) by mouth daily at 6 PM. 30 tablet 0  . clonazePAM (KLONOPIN) 0.5 MG tablet Take 0.5 mg by mouth at bedtime.     [facility] loratadine (CLARITIN) 10 MG tablet Take 10 mg by mouth daily.    . Multiple Vitamin (MULTIVITAMIN WITH MINERALS) TABS tablet Take 1 tablet by mouth daily.    . nicotine polacrilex (NICORETTE) 4 MG gum Take 4 mg by mouth as needed for smoking cessation.     No facility-administered medications prior to visit.     Allergies:   Sulfa antibiotics, Amoxicillin, and Penicillins   Social History   Socioeconomic History  . Marital status: Married    Spouse name: Krystal Oneal  . Number of children: 2  . Years of education:  Not on file  . Highest education level: High school graduate  Occupational History  . Occupation: Control and instrumentation engineer    Comment: Therapist, sports  Tobacco Use  . Smoking status: Former Smoker    Packs/day: 1.00    Years: 30.00    Pack years: 30.00    Types: Cigarettes    Quit date: 2017    Years since quitting: 4.1  . Smokeless tobacco: Never Used  Substance and Sexual Activity  . Alcohol use: Never  . Drug use: Never  . Sexual activity: Not on file  Other Topics Concern  . Not on file  Social History Narrative  . Not on file   Social Determinants of Health   Financial Resource Strain:   . Difficulty of Paying  Living Expenses: Not on file  Food Insecurity:   . Worried About Charity fundraiser in the Last Year: Not on file  . Ran Out of Food in the Last Year: Not on file  Transportation Needs:   . Lack of Transportation (Medical): Not on file  . Lack of Transportation (Non-Medical): Not on file  Physical Activity:   . Days of Exercise per Week: Not on file  . Minutes of Exercise per Session: Not on file  Stress:   . Feeling of Stress : Not on file  Social Connections:   . Frequency of Communication with Friends and Family: Not on file  . Frequency of Social Gatherings with Friends and Family: Not on file  . Attends Religious Services: Not on file  . Active Member of Clubs or Organizations: Not on file  . Attends Archivist Meetings: Not on file  . Marital Status: Not on file     Family History:  The patient's family history includes Breast cancer in her maternal aunt; CVA (age of onset: 54) in her mother; Chronic Renal Failure in her mother; Heart disease in her paternal grandfather and paternal grandmother; Hypertension in her brother, father, and mother; Leukemia in her maternal grandfather; Lung cancer in her half-sister; Other in her maternal grandmother; Stomach cancer in her maternal aunt.      ROS:   Please see the history of present illness.    ROS All other systems reviewed and are negative.   PHYSICAL EXAM:   VS:  BP 122/80   Pulse 83   Ht 5\' 2"  (1.575 m)   Wt 153 lb (69.4 kg)   SpO2 97%   BMI 27.98 kg/m    GEN: Well nourished, well developed, in no acute distress HEENT: normal Neck: no JVD or masses Cardiac: RRR; no murmurs, rubs, or gallops,no edema  Respiratory:  clear to auscultation bilaterally, normal work of breathing GI: soft, nontender, nondistended, + BS MS: no deformity or atrophy Skin: warm and dry, no rash Neuro:  Alert and Oriented x 3, Strength and sensation are intact Psych: euthymic mood, full affect   Wt Readings from Last 3 Encounters:   08/14/19 153 lb (69.4 kg)  08/01/19 152 lb (68.9 kg)  08/29/18 147 lb (66.7 kg)      Studies/Labs Reviewed:   EKG:  EKG is NOT ordered today.    Recent Labs: No results found for requested labs within last 8760 hours.   Lipid Panel    Component Value Date/Time   CHOL 134 07/05/2018 0616   TRIG 117 07/05/2018 0616   HDL 32 (L) 07/05/2018 0616   CHOLHDL 4.2 07/05/2018 0616   VLDL 23 07/05/2018 0616   LDLCALC 79 07/05/2018  2130    Additional studies/ records that were reviewed today include:   07/25/18 PATENT FORAMEN OVALE (PFO) CLOSURE  Conclusion  Successful transcatheter PFO closure under fluoroscopic and intracardiac echo guidance  Recommend: limited echo prior to discharge today. ASA/clopidogrel x 3 months, then continue ASA 81 mg daily.    ______________  Echo 07/25/2018 IMPRESSIONS  1. The left ventricle has normal systolic function of 60-65%. The cavity size is normal. There is no increased left ventricular wall thickness. Left ventricular diastology could not be evaluated due to nondiagnostic images.  2. The right ventricle has normal systolic function. The cavity in normal in size. There is no increase in right ventricular wall thickness.  3. The aortic valve is tricuspid. There is mild thickening of the aortic valve.  4. The pulmonic valve is normal in structure. Pulmonic valve regurgitation was not assessed by color flow Doppler.  5. Limited study; normal LV systolic function; s/p PFO closure with no obvious residual shunt based on color doppler.  ________________  Echo with bubble 08/14/19 IMPRESSIONS    1. Left ventricular ejection fraction, by estimation, is 60 to 65%. The  left ventricle has normal function. The left ventricle has no regional  wall motion abnormalities. Left ventricular diastolic parameters were  normal.  2. Right ventricular systolic function is normal. The right ventricular  size is normal. Tricuspid regurgitation signal is  inadequate for assessing  PA pressure.  3. S/P PFO closure 07/25/18. No residual shunt seen.. Agitated saline  contrast bubble study was negative, with no evidence of any interatrial  shunt.  4. The mitral valve is normal in structure and function. No evidence of  mitral valve regurgitation. No evidence of mitral stenosis.  5. The aortic valve is tricuspid. Aortic valve regurgitation is not  visualized. No aortic stenosis is present.  6. The inferior vena cava is normal in size with greater than 50% respiratory variability, suggesting right atrial pressure of 3 mmHg.    ASSESSMENT & PLAN:   PFO s/p PFO closure: Echo today shows EF 60%, normally functioning PFO closure with no residual shunt shunt and negative bubble study. The patient is doing quite well with no residual or new neurologic symptoms. She will continue on a baby aspirin indefinitely. 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 She will follow with [place] as needed.  Medication Adjustments/Labs and Tests Ordered: Current medicines are reviewed at length with the patient today.  Concerns regarding medicines are outlined above.  Medication changes, Labs and Tests ordered today are listed in the Patient Instructions below. Patient Instructions  Medication Instructions:  Your physician recommends that you continue on your current medications as directed. Please refer to the Current Medication list given to you today.  Lab Work: None ordered  Testing/Procedures: None ordered  Follow-Up: At [facility], you and your health needs are our priority.  As part of our continuing mission to provide you with exceptional heart care, we have created designated Provider Care Teams.  These Care Teams include your primary Cardiologist (physician) and Advanced Practice Providers (APPs -  Physician Assistants and Nurse Practitioners) who all work together to provide you with the care you need, when you need  it.  Your next appointment:   No follow up is needed at this time with Dr. Excell Oneal.  He will see you on an as needed basis.     Signed, Cline Crock, PA-C  08/15/2019 6:56 AM    [facility]  [address], [place], [place]  [address] Phone: [phone]; Fax: [phone]

## 2019-08-14 ENCOUNTER — Encounter: Payer: Self-pay | Admitting: Physician Assistant

## 2019-08-14 ENCOUNTER — Other Ambulatory Visit (HOSPITAL_COMMUNITY): Payer: BC Managed Care – PPO

## 2019-08-14 ENCOUNTER — Ambulatory Visit: Payer: BC Managed Care – PPO | Admitting: Physician Assistant

## 2019-08-14 ENCOUNTER — Ambulatory Visit (HOSPITAL_COMMUNITY): Payer: BC Managed Care – PPO | Attending: Cardiology

## 2019-08-14 ENCOUNTER — Other Ambulatory Visit: Payer: Self-pay

## 2019-08-14 VITALS — BP 122/80 | HR 83 | Ht 62.0 in | Wt 153.0 lb

## 2019-08-14 DIAGNOSIS — Z8774 Personal history of (corrected) congenital malformations of heart and circulatory system: Secondary | ICD-10-CM

## 2019-08-14 MED ORDER — SODIUM CHLORIDE 0.9% FLUSH
10.0000 mL | INTRAVENOUS | Status: AC | PRN
  Administered 2019-08-14: 20 mL via INTRAVENOUS

## 2019-08-14 MED ORDER — PERFLUTREN LIPID MICROSPHERE
1.0000 mL | INTRAVENOUS | Status: AC | PRN
  Administered 2019-08-14: 1 mL via INTRAVENOUS

## 2019-08-14 NOTE — Patient Instructions (Signed)
Medication Instructions:  Your physician recommends that you continue on your current medications as directed. Please refer to the Current Medication list given to you today.  Lab Work: None ordered  Testing/Procedures: None ordered  Follow-Up: At [facility], you and your health needs are our priority.  As part of our continuing mission to provide you with exceptional heart care, we have created designated Provider Care Teams.  These Care Teams include your primary Cardiologist (physician) and Advanced Practice Providers (APPs -  Physician Assistants and Nurse Practitioners) who all work together to provide you with the care you need, when you need it.  Your next appointment:   No follow up is needed at this time with Dr. Excell Seltzer.  He will see you on an as needed basis.

## 2023-04-24 ENCOUNTER — Encounter: Payer: Self-pay | Admitting: *Deleted
